# Patient Record
Sex: Female | Born: 1990 | Hispanic: Yes | Marital: Married | Smoking: Never smoker
Health system: Southern US, Community
[De-identification: ages and names within clinical notes are randomized; demographics above are authoritative.]

## PROBLEM LIST (undated history)

## (undated) ENCOUNTER — Inpatient Hospital Stay (HOSPITAL_COMMUNITY): Payer: Self-pay

## (undated) DIAGNOSIS — E669 Obesity, unspecified: Secondary | ICD-10-CM

## (undated) DIAGNOSIS — Z789 Other specified health status: Secondary | ICD-10-CM

---
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
## 2008-01-14 ENCOUNTER — Ambulatory Visit: Payer: Self-pay | Admitting: Family Medicine

## 2008-02-24 ENCOUNTER — Ambulatory Visit: Payer: Self-pay | Admitting: Family Medicine

## 2008-05-26 ENCOUNTER — Ambulatory Visit: Payer: Self-pay | Admitting: Family Medicine

## 2008-07-15 IMAGING — US US OB COMP LESS 14 WK
1 series · 14 of 16 positions shown · non-contrast
Comparison: None.

CLINICAL DATA: 16-year-old 12 weeks pregnant with bleeding.
 OBSTETRICAL ULTRASOUND <14 WKS:
TECHNIQUE: Transabdominal ultrasound was performed for evaluation of the gestation as well as the maternal uterus and adnexal regions.

[Series 1: us ob comp less 14 wk · 0.33mm/px · 14 of 16 slices shown]
[im 1/16]
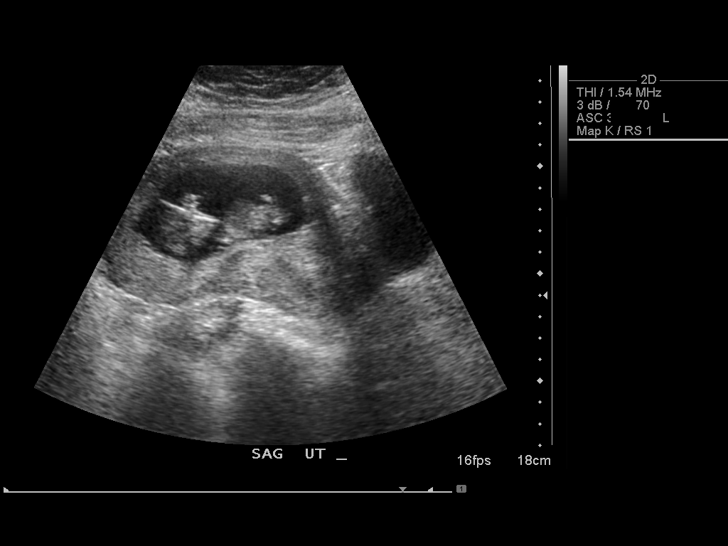
[im 2/16]
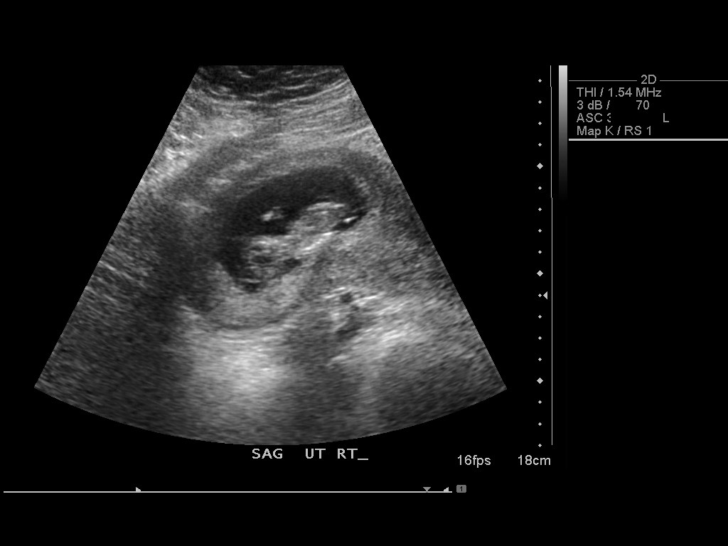
[im 3/16]
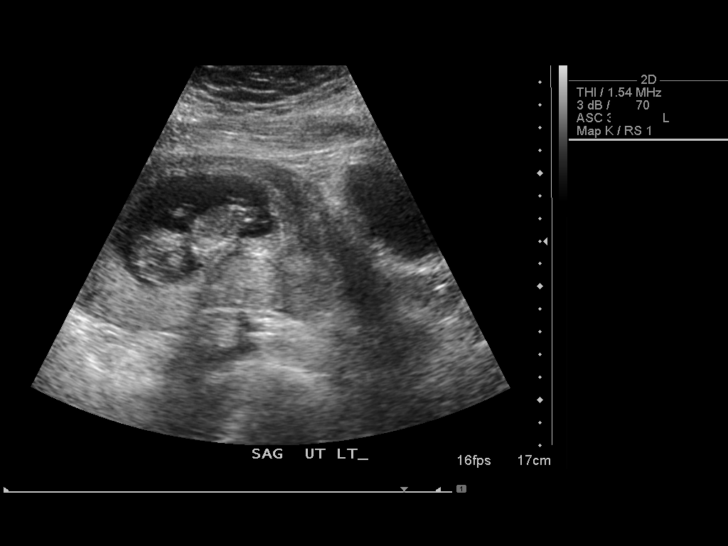
[im 5/16]
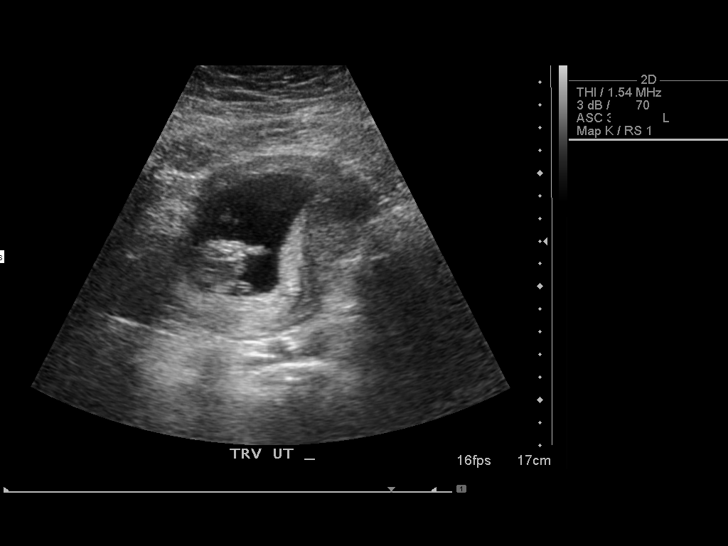
[im 6/16]
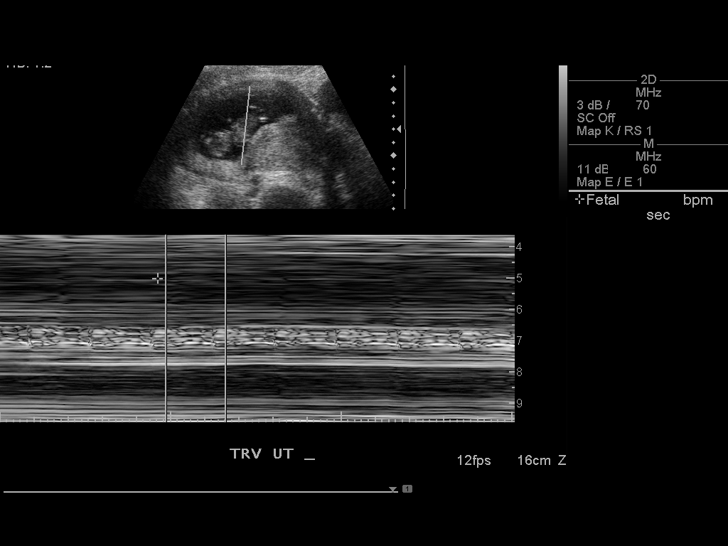
[im 7/16]
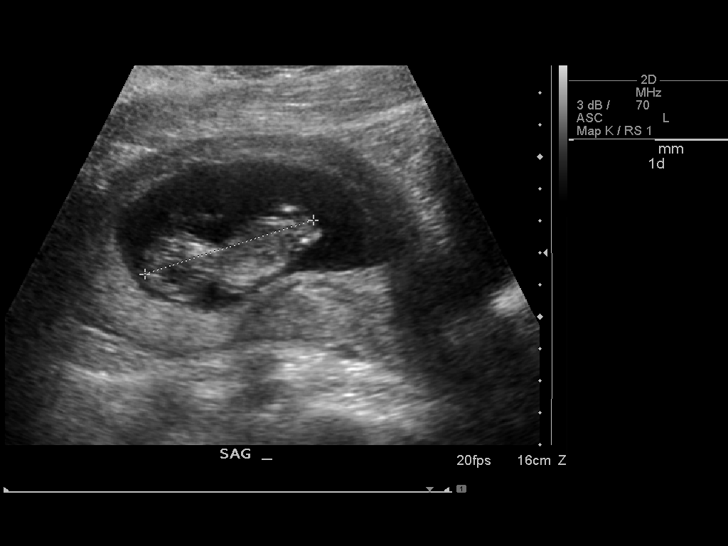
[im 8/16]
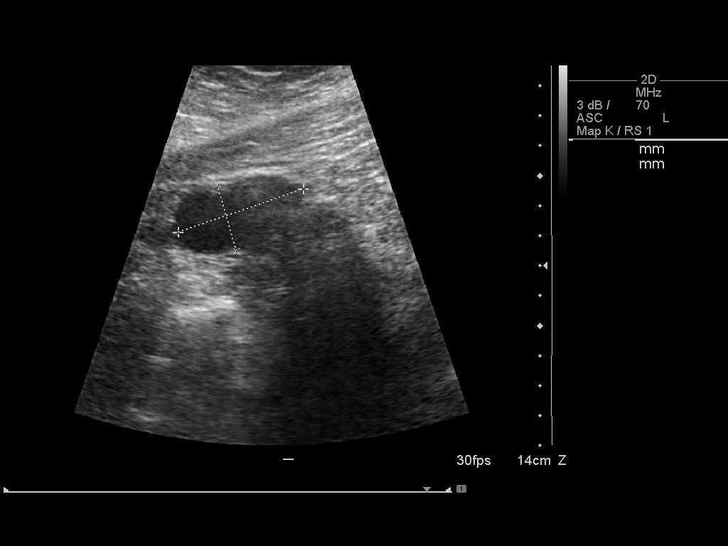
[im 9/16]
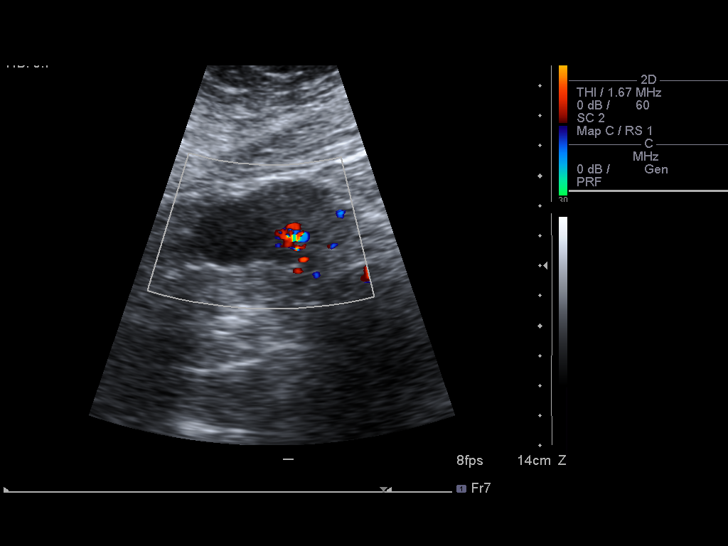
[im 10/16]
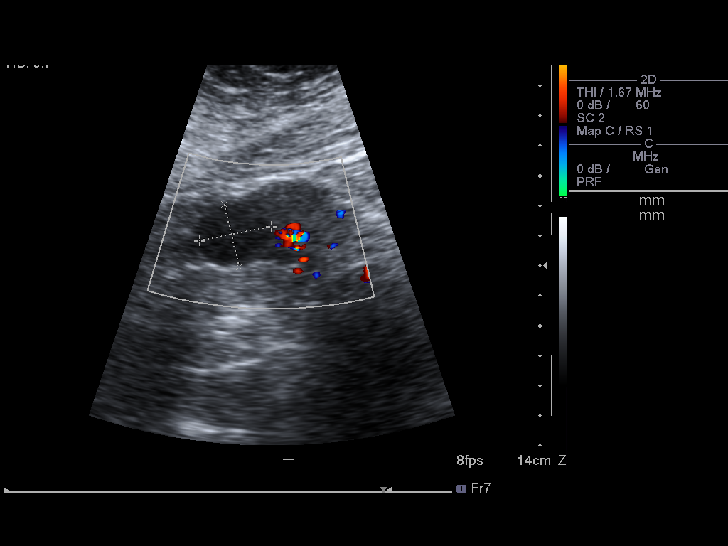
[im 11/16]
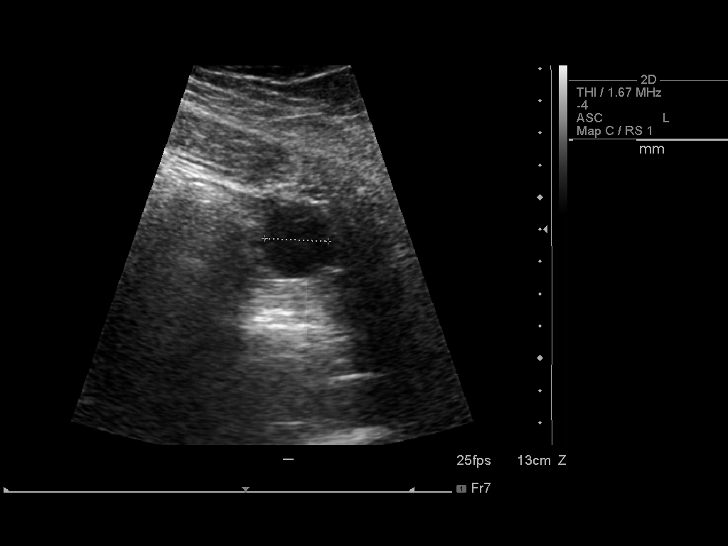
[im 13/16]
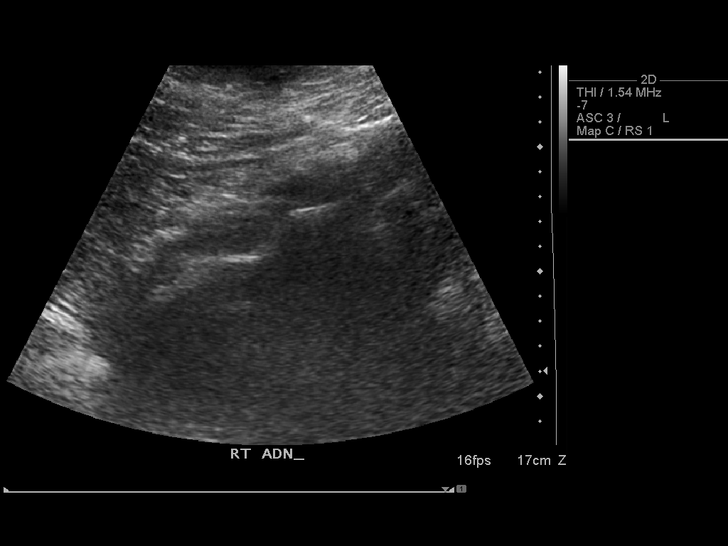
[im 14/16]
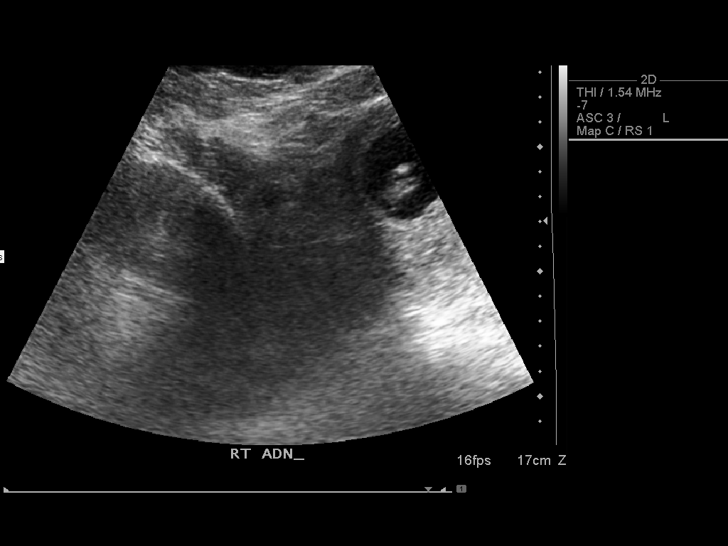
[im 15/16]
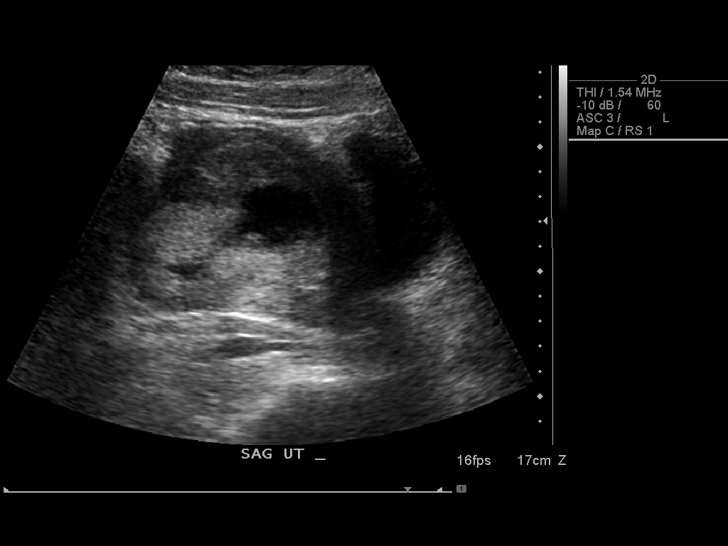
[im 16/16]
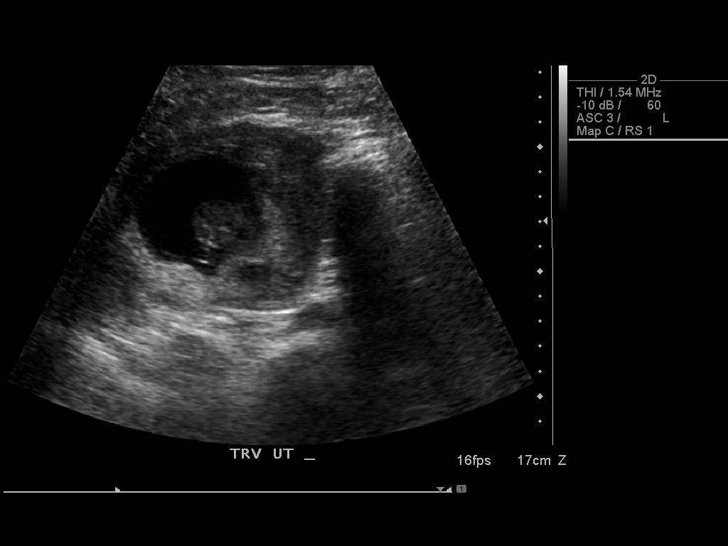

[14 of 16 positions shown; findings below may reference images not displayed]

FINDINGS: There is an intrauterine gestational sac with a living fetus estimated at 12 weeks 1 day gestation based on crown rump length of 55.3 mm.  Heart rate is 171 beats per minute.
 Small subchorionic hemorrhage is noted.  The right ovary could not be visualized.  The left ovary is normal in size and has a corpus luteum cyst associated with it. 
 No free pelvic fluid collections are seen.
IMPRESSION: Living      intrauterine fetus estimated at 12 weeks 1 day gestation.
  Small      subchorionic hemorrhage.
  Corpus      luteum cyst left ovary.
  No      free pelvic fluid.

## 2008-08-25 ENCOUNTER — Ambulatory Visit: Payer: Self-pay | Admitting: Family Medicine

## 2009-06-20 IMAGING — CR DG CHEST 1V PORT
1 series · 1 of 1 positions shown · non-contrast
Comparison: None

CLINICAL DATA: Fever, nonproductive cough, and shortness of breath.

PORTABLE CHEST - 1 VIEW

[view not recorded]
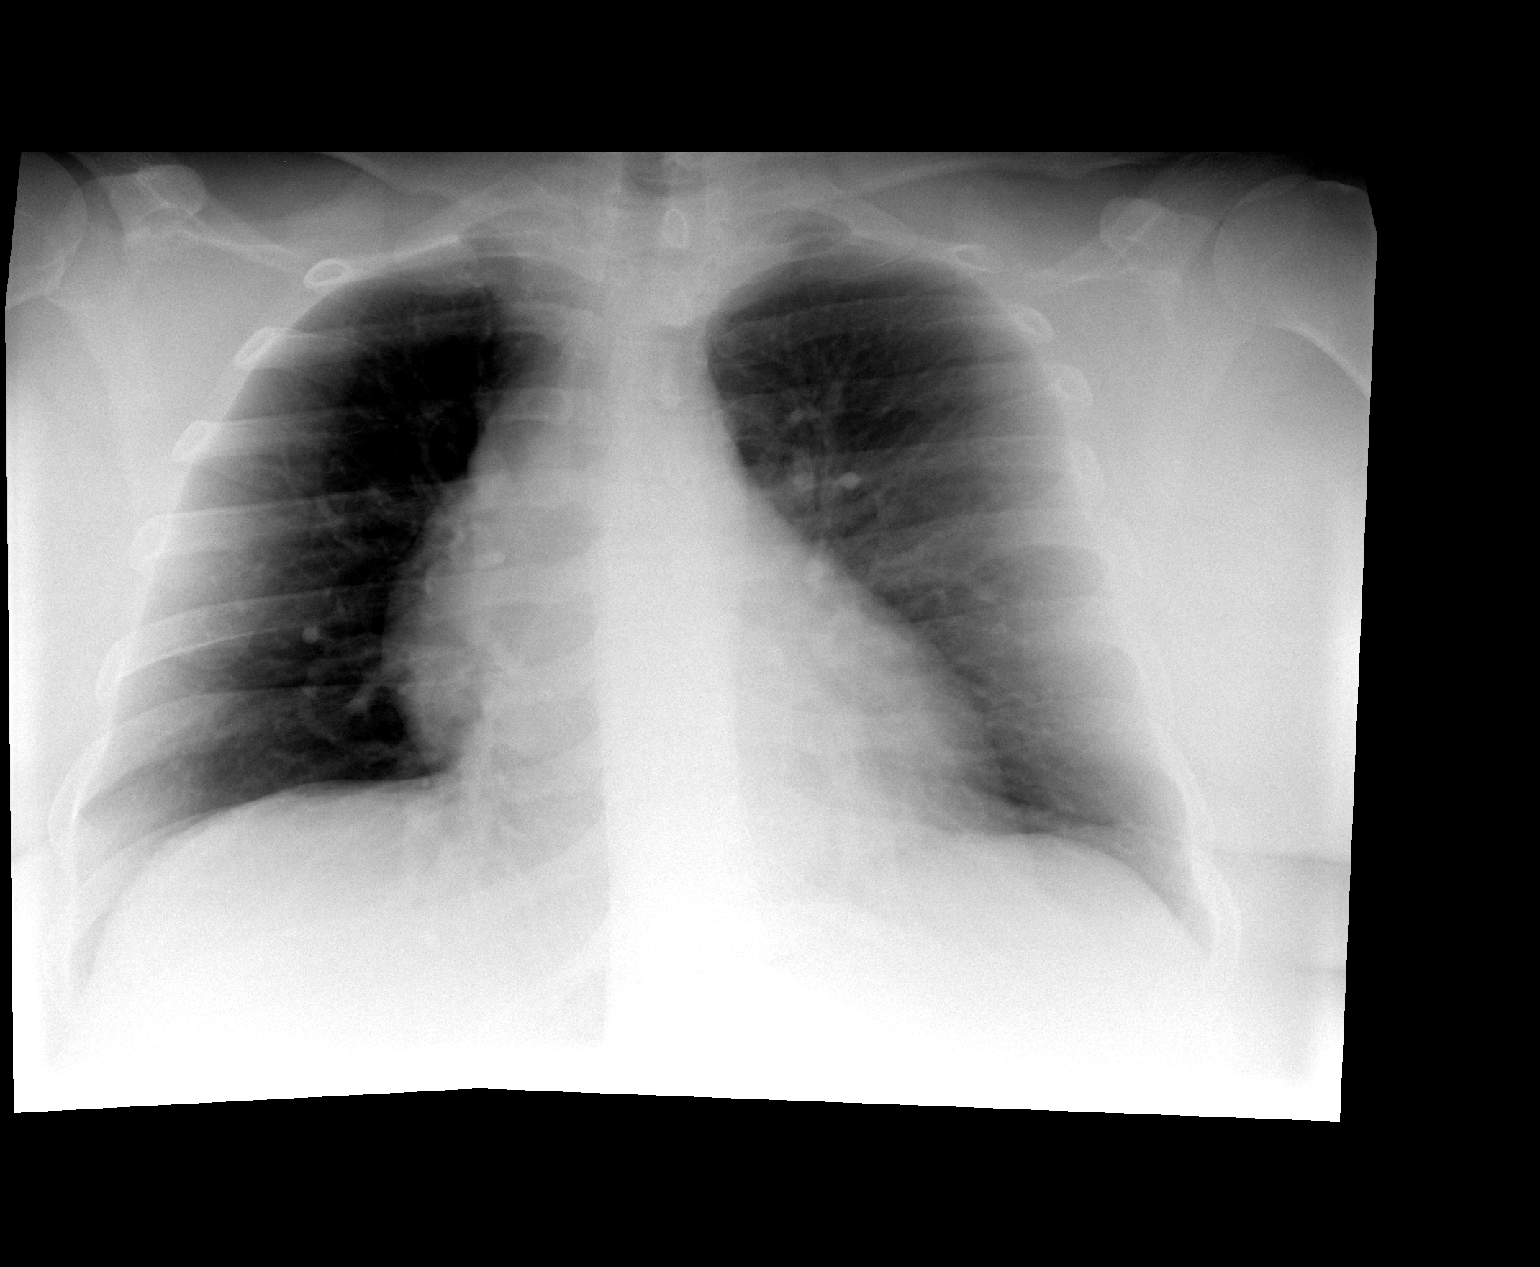

[1 of 1 positions shown; findings below may reference images not displayed]

FINDINGS: The heart size and vascularity are normal and the lungs
are clear.  No bony abnormality.
IMPRESSION: Normal chest.

## 2009-06-21 IMAGING — CR DG CHEST 1V PORT
1 series · 1 of 1 positions shown · non-contrast
Comparison: November 27, 2007

CLINICAL DATA: Fever and cough after C-section

PORTABLE CHEST - 1 VIEW

[view not recorded]
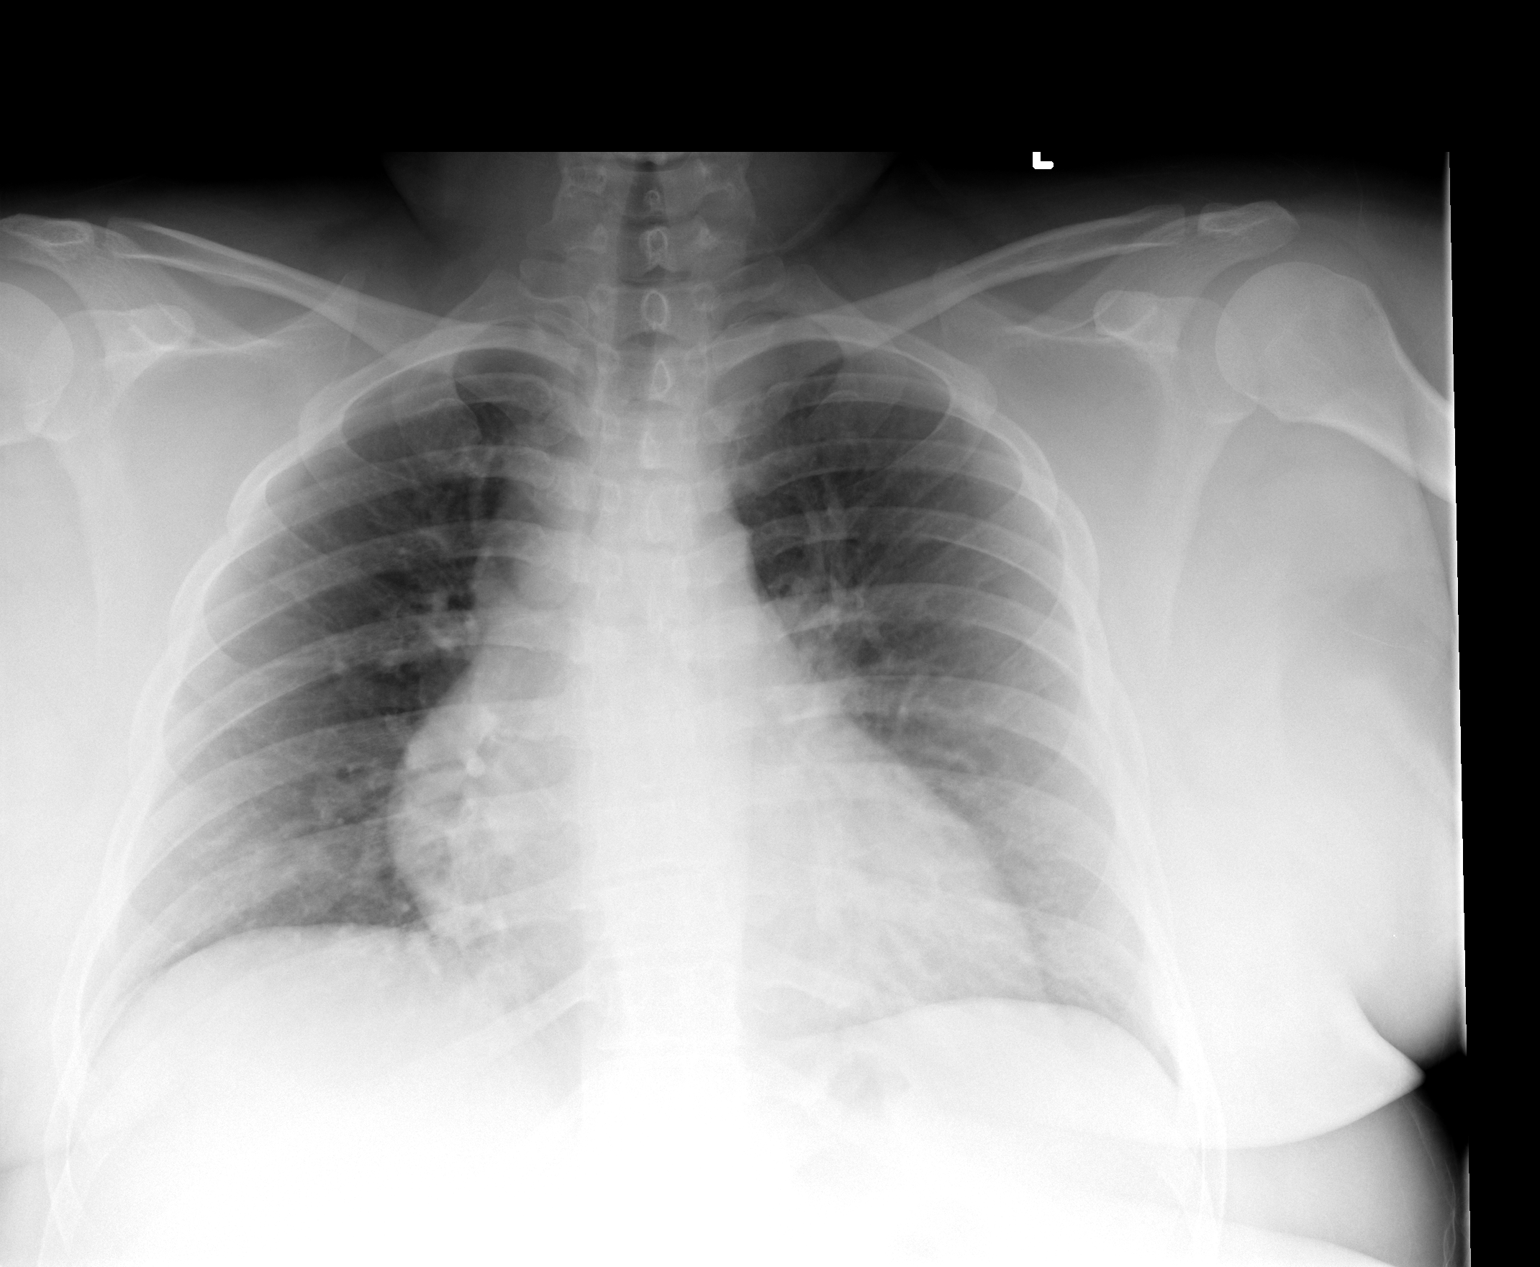

[1 of 1 positions shown; findings below may reference images not displayed]

FINDINGS: Subtle patchy opacity is present within the right lower
lung.  No gross pleural effusion is identified.  The left lung is
clear.  The cardiac silhouette, mediastinum, pulmonary vasculature
are within normal limits.
IMPRESSION: Question developing infiltrate in the right lower lung.

## 2010-01-13 ENCOUNTER — Ambulatory Visit: Payer: Self-pay | Admitting: Family Medicine

## 2010-01-13 ENCOUNTER — Encounter: Payer: Self-pay | Admitting: Family Medicine

## 2010-01-13 LAB — CONVERTED CEMR LAB
Antibody Screen: NEGATIVE
Basophils Absolute: 0 10*3/uL (ref 0.0–0.1)
Basophils Relative: 0 % (ref 0–1)
Eosinophils Absolute: 0.2 10*3/uL (ref 0.0–0.7)
Eosinophils Relative: 2 % (ref 0–5)
HCT: 38.5 % (ref 36.0–46.0)
HIV: NONREACTIVE
Hemoglobin: 12.8 g/dL (ref 12.0–15.0)
Hepatitis B Surface Ag: NEGATIVE
Lymphocytes Relative: 21 % (ref 12–46)
Lymphs Abs: 1.7 10*3/uL (ref 0.7–4.0)
MCHC: 33.2 g/dL (ref 30.0–36.0)
MCV: 95.3 fL (ref 78.0–100.0)
Monocytes Absolute: 0.2 10*3/uL (ref 0.1–1.0)
Monocytes Relative: 3 % (ref 3–12)
Neutro Abs: 5.8 10*3/uL (ref 1.7–7.7)
Neutrophils Relative %: 74 % (ref 43–77)
Platelets: 228 10*3/uL (ref 150–400)
RBC: 4.04 M/uL (ref 3.87–5.11)
RDW: 14 % (ref 11.5–15.5)
Rh Type: POSITIVE
Rubella: 104.3 intl units/mL — ABNORMAL HIGH
Sickle Cell Screen: NEGATIVE
WBC: 7.9 10*3/uL (ref 4.0–10.5)

## 2010-01-20 ENCOUNTER — Ambulatory Visit: Payer: Self-pay | Admitting: Family Medicine

## 2010-01-20 LAB — CONVERTED CEMR LAB: Pap Smear: NEGATIVE

## 2010-02-03 ENCOUNTER — Telehealth: Payer: Self-pay | Admitting: Family Medicine

## 2010-02-11 ENCOUNTER — Encounter: Payer: Self-pay | Admitting: Family Medicine

## 2010-02-15 ENCOUNTER — Ambulatory Visit: Admission: RE | Admit: 2010-02-15 | Discharge: 2010-02-15 | Payer: Self-pay | Source: Home / Self Care

## 2010-02-15 ENCOUNTER — Encounter: Payer: Self-pay | Admitting: Family Medicine

## 2010-02-15 LAB — CONVERTED CEMR LAB
Chlamydia, DNA Probe: NEGATIVE
GC Probe Amp, Genital: NEGATIVE

## 2010-02-21 LAB — GLUCOSE, CAPILLARY: Glucose-Capillary: 100 mg/dL — ABNORMAL HIGH (ref 70–99)

## 2010-03-10 NOTE — Assessment & Plan Note (Signed)
Summary: NOB/AAM/DSL   Vital Signs:  Patient profile:   20 year old female LMP:     09/20/2009 Weight:      192.06 pounds Pulse rate:   82 / minute BP sitting:   136 / 77  Vitals Entered By: Jone Baseman CMA (January 20, 2010 3:36 PM) LMP (date): 09/20/2009 EDC by LMP==> 06/27/2009 LMP - Character: normal LMP - Reliable? approximate (month known) Menarche (age onset years): 8   Menses interval (days): irregular Menstrual flow (days): 7 On BCP's at conception: no Date of + home preg. test: 10/07/2009 Enter LMP: 09/20/2009   Current Problems (verified): 1)  Supervision of Other Normal Pregnancy  (ICD-V22.1) 2)  Pregnancy With History of Abortion  (ICD-V23.2) 3)  Abortion, Spontaneous, Complete, With Hemorrhage  (ZOX-096.04)  Current Medications (verified): 1)  Prenatal Vitamins 0.8 Mg Tabs (Prenatal Multivit-Min-Fe-Fa) .... One By Mouth Daily  Past History:  Past Medical History: Pt had fever during labor with first child, with concern for PNA or Influenza, and had c/s secondary to late decellerations and failure to progress.   Social History: Education:  Graduated HS in [place]   Impression & Recommendations:  Problem # 1:  SUPERVISION OF OTHER NORMAL PREGNANCY (ICD-V59.13) 20 year old G3P1011 at 17.3 weeks.  Pap smear done today.  Will refer for anatomy US and follow up in 4 weeks.  Orders: Other OB visit- [facility] (OBCK)  Complete Medication List: 1)  Prenatal Vitamins 0.8 Mg Tabs (Prenatal multivit-min-fe-fa) .... One by mouth daily  Other Orders: Prenatal U/S > 14 weeks - 54098 (Prenatal U/S) Pap Smear-[facility] ([id number])  Patient Instructions: 1)  It was nice to meet you today.  The office will call you to schedule your Korea.   2)  Please make an appointment to see me again in 4 weeks. 3)  Please take a prenatal vitamin every day 4)  Contact the office if you have any problems or questions.    Orders Added: 1)  Prenatal U/S > 14 weeks - 62130 [Prenatal  U/S] 2)  Pap Smear-[facility] [[id number]] 3)  Other OB visit- [facility] [OBCK]     OB Initial Intake Information    Positive HCG by: [facility]    Race: Hispanic    Marital status: Single    Occupation: homemaker    Education (last grade completed): Graduated HS in [place]    Number of children at home: 1    [facility]    Newborn's physician: Ruthe Mannan  FOB Information    Husband/Father of baby: Roseanne Reno    FOB occupation Holiday representative    Phone: [phone]    FOB Comments: FOB is involved.  Menstrual History    LMP (date): 09/20/2009    EDC by LMP: 06/27/2009    LMP - Character: normal    Menarche: 8 years    Menses interval: irregular days    Menstrual flow 7 days    On BCP's at conception: no    Date of positive (+) home preg. test: 10/07/2009    Pre Pregnancy Weight: 180 lbs.   Flowsheet View for Follow-up Visit    Estimated weeks of       gestation:     17.3    Weight:     192.06    Blood pressure:   136 / 77    Headache:     No    Nausea/vomiting:   nausea    Edema:     0  Vaginal bleeding:   no    Vaginal discharge:   no   Past Pregnancy History    Gravida:     3    Living Children:   1    Para:       1  Pregnancy # 2    Delivery date:     11/27/2007    Weeks Gestation:   term    Preterm labor:     no    Delivery type:     C-section    Delivery location:     [facility]    Infant Sex:     Female    Name:     Cala Bradford    Comments:     Pt. had fever during labor with first child and was treated for PNA and Influenza.  C/S for late decellerations.    Genetic History     Down's Syndrome:           comments: Paternal cousin has ? down's syndrome  Genetic Counseling:    Pt. declined quad screen or any genetic testing.    Appended Document: NOB/AAM/DSL Pt needs early glucola for obesity, ethnic background. Needs flu shot. Will need review of infections/exposures. Pt has h/o section.  Will need appt with OBs at 30  weeks to discuss delivery planning.  Appended Document: NOB/AAM/DSL pt also needs gc/cz screening.

## 2010-03-10 NOTE — Progress Notes (Signed)
Summary: status  Phone Note Call from Patient   Caller: Patient Summary of Call: Pt called to ask for Korea status.  Initial call taken by: Marines Jean Rosenthal,  February 03, 2010 10:36 AM  Follow-up for Phone Call        Marines this is the one that you were giving the appt to an AAM interpretor, that was in the office yesterday,  to call and let her know  Appt @ Dr.Marshalls office for 1.6.12 @ 10am.  AAM interpretor to inform per [employer]. ............................................... Delora Fuel February 08, 2010 9:58 AM Follow-up by: Jone Baseman CMA,  February 09, 2010 5:28 PM

## 2010-03-10 NOTE — Assessment & Plan Note (Signed)
Summary: ob visit/eo   Vital Signs:  Patient profile:   20 year old female Weight:      194.2 pounds Pulse rate:   101 / minute BP sitting:   108 / 73  Vitals Entered By: Garen Grams LPN (February 15, 2010 2:17 PM)   Impression & Recommendations:  Problem # 1:  SUPERVISION OF OTHER NORMAL PREGNANCY (ICD-V22.1) Pt. with BMI >30, so early 1 hour glucola done, which was normal.  Will repeat at 28 weeks.  Pt. reports she got her US done at Dr. Elsie Stain office, will try to get report.  G/C Chlamydia was not done on initial OB labs, so the screen was done today.  Overall pt. doing well.  Orders: Glucose 1 hr-[facility] (82950) GC/Chlamydia-[facility] (87591/87491) Other OB visit- [facility] (OBCK)  Complete Medication List: 1)  Prenatal Vitamins 0.8 Mg Tabs (Prenatal multivit-min-fe-fa) .... One by mouth daily  Patient Instructions: 1)  It was good to see you.  Your baby's heart rate today was 135, which is normal.  It is OK if you only feel your baby moving a little at this point in your pregnancy- I think you will feel baby moving more over the next four weeks. 2)  Take your prenatal vitamin every day 3)  Come back and see me again in 4 weeks for follow up.    Orders Added: 1)  Glucose 1 hr-[facility] [82950] 2)  GC/Chlamydia-[facility] [[id number]] 3)  Other OB visit- [facility] [OBCK]     OB Initial Intake Information    Positive HCG by: [facility]    Race: Hispanic    Marital status: Single    Occupation: homemaker    Education (last grade completed): Graduated HS in [place]    Number of children at home: 1    [facility]ospital [facility] delivery: [facility]    Newborn's physician: Ruthe Mannan  FOB Information    Husband/Father of baby: Roseanne Reno    FOB occupation Holiday representative    Phone: [phone]    FOB Comments: FOB is involved.  Menstrual History    LMP (date): 09/20/2009    EDC by LMP: 06/27/2010    Best Working EDC: 06/27/2010    LMP - Character: normal    Menarche: 8 years    Menses  interval: irregular days    Menstrual flow 7 days    On BCP's at conception: no    Date of positive (+) home preg. test: 10/07/2009   Flowsheet View for Follow-up Visit    Estimated weeks of       gestation:     21 1/7    Weight:     194.2    Blood pressure:   108 / 73    Headache:     No    Nausea/vomiting:   No    Edema:     0    Vaginal bleeding:   no    Vaginal discharge:   no    Fundal height:      22    FHR:       135    Fetal activity:     yes    Labor symptoms:   no    Fetal position:     ??    Taking prenatal vits?   Y    Smoking:     n/a    Next visit:     4 wk    Resident:     Lula Olszewski   Prenatal Visit Concerns  noted: Pt says she is feeling the baby move a little bit, but is wondering if she should feel baby move more by 21 weeks  She says she had an Korea at Dr. Elsie Stain office, but they could not tell if the baby was a boy or a girl.  EDC Confirmation:    New working [facility]: 06/27/2010    EDC by LMP: 06/27/2010   Past Pregnancy History    Gravida:     3    Term Births:     0    Premature Births:   0    Living Children:   1    Para:       1    Mult. Births:     0    Prev C-Section:   0    Aborta:     0    Elect. Ab:     0    Spont. Ab:     1    Ectopics:     0  Pregnancy # 1    Delivery date:     12/31/2006    Weeks Gestation:   13    Comments:     septic abortion  Pregnancy # 2    Delivery date:     11/27/2007    Weeks Gestation:   term    Preterm labor:     no    Delivery type:     C-section    Delivery location:     [facility]    Infant Sex:     Female    Name:     Cala Bradford    Comments:     Pt. had fever during labor with first child and was treated for PNA and Influenza.  C/S for late decellerations.

## 2010-03-11 ENCOUNTER — Encounter: Payer: Self-pay | Admitting: Family Medicine

## 2010-03-21 ENCOUNTER — Ambulatory Visit (INDEPENDENT_AMBULATORY_CARE_PROVIDER_SITE_OTHER): Payer: Self-pay | Admitting: Family Medicine

## 2010-03-21 ENCOUNTER — Encounter: Payer: Self-pay | Admitting: Family Medicine

## 2010-03-21 VITALS — BP 108/68 | Wt 199.2 lb

## 2010-03-21 DIAGNOSIS — Z3480 Encounter for supervision of other normal pregnancy, unspecified trimester: Secondary | ICD-10-CM

## 2010-03-21 DIAGNOSIS — Z23 Encounter for immunization: Secondary | ICD-10-CM

## 2010-03-21 MED ORDER — INFLUENZA VAC TYP A&B SURF ANT IM INJ
0.5000 mL | INJECTION | Freq: Once | INTRAMUSCULAR | Status: AC
  Administered 2010-03-21: 0.5 mL via INTRAMUSCULAR

## 2010-03-21 NOTE — Patient Instructions (Signed)
It was good to see you.  I want you to make an appointment to be seen here in the office in the [facility] in two weeks and see me again in four weeks.  Please contact the office if you have questions.

## 2010-03-22 DIAGNOSIS — Z3480 Encounter for supervision of other normal pregnancy, unspecified trimester: Secondary | ICD-10-CM | POA: Insufficient documentation

## 2010-03-22 NOTE — Progress Notes (Deleted)
  Subjective:    Autumn Johnson is being seen today for a follow up obstetrical visit.  This is a planned pregnancy. She is at [redacted]w[redacted]d gestation. Her obstetrical history is significant for obesity. Relationship with FOB: significant other, living together. Patient does intend to breast feed. Pregnancy history fully reviewed.   Patient reports backache. No other complaints.  She asks questions about what exercise is safe during pregnancy.     Objective:     BP 108/68  Wt 199 lb 3.2 oz (90.357 kg)  LMP 09/20/2009 Physical Exam  Exam General appearance: alert, cooperative and no distress Eyes: PERRL, EOMIT Nose: no discharge Throat: lips, mucosa, and tongue normal; teeth and gums normal Heart: regular rate and rhythm, S1, S2 normal, no murmur, click, rub or gallop Abdomen: Gravid, non-tender Extremities: extremities normal, atraumatic, no cyanosis or edema Pulses: 2+ and symmetric Skin: Skin color, texture, turgor normal. No rashes or lesionsmr  Assessment:    Pregnancy: G3P1011 Patient Active Problem List  Diagnoses  . ABORTION, SPONTANEOUS, COMPLETE, WITH HEMORRHAGE  . Supervision of other normal pregnancy       Plan:    Continue Prenatal vitamins. Pt with BMI > 30, Early Glucola done, was negative.  Will repeat next visit (28 weeks).  Discussed safe exercise (walking, dancing OK but no bending/heavy lifting).    Follow up in 2 weeks.  Autumn Johnson 03/22/2010

## 2010-03-22 NOTE — Progress Notes (Signed)
Subjective:   Autumn Johnson is being seen today for a follow up obstetrical visit. This is a planned pregnancy. She is at [redacted]w[redacted]d gestation. Her obstetrical history is significant for obesity. Relationship with FOB: significant other, living together. Patient does intend to breast feed. Pregnancy history fully reviewed.  Patient reports backache. No other complaints. She asks questions about what exercise is safe during pregnancy.  Objective:    BP 108/68  Wt 199 lb 3.2 oz (90.357 kg)  LMP 09/20/2009  Physical Exam  Exam  General appearance: alert, cooperative and no distress  Eyes: PERRL, EOMIT  Nose: no discharge  Throat: lips, mucosa, and tongue normal; teeth and gums normal  Heart: regular rate and rhythm, S1, S2 normal, no murmur, click, rub or gallop  Abdomen: Gravid, non-tender  Extremities: extremities normal, atraumatic, no cyanosis or edema  Pulses: 2+ and symmetric  Skin: Skin color, texture, turgor normal. No rashes or lesionsmr  Assessment:   Pregnancy: G3P1011  Patient Active Problem List   Diagnoses   .  ABORTION, SPONTANEOUS, COMPLETE, WITH HEMORRHAGE   .  Supervision of other normal pregnancy     Plan:   Continue Prenatal vitamins.  Flu Shot today. Pt with BMI > 30, Early Glucola done, was negative. Will repeat next visit (28 weeks).  Discussed safe exercise (walking, dancing OK but no bending/heavy lifting).  Follow up in 2 weeks.  Khalani Novoa  03/22/2010

## 2010-03-22 NOTE — Assessment & Plan Note (Signed)
Pt. Doing well.  Discussed goal of minimal weight gain for the remainder of pregnancy and safe exercises. Flu shot today. Pt. Will follow up in two weeks, repeat 1 hour Glucola.

## 2010-04-04 ENCOUNTER — Ambulatory Visit (INDEPENDENT_AMBULATORY_CARE_PROVIDER_SITE_OTHER): Payer: Self-pay | Admitting: Family Medicine

## 2010-04-04 VITALS — BP 121/71 | Temp 97.9°F | Wt 201.0 lb

## 2010-04-04 DIAGNOSIS — Z3480 Encounter for supervision of other normal pregnancy, unspecified trimester: Secondary | ICD-10-CM

## 2010-04-04 LAB — GLUCOSE, CAPILLARY: Glucose-Capillary: 93 mg/dL (ref 70–99)

## 2010-04-04 NOTE — Progress Notes (Signed)
  Subjective:    Patient ID: Autumn Johnson, female    DOB: 1990-10-11, 20 y.o.   MRN: [id number]  HPI Pt. Is a here for her Prenatal Exam. She has had some minor spotting. She has a normal fetal heart tone today on doppler. She has no other questions. She plans on use contraceptives when the baby is born.   Review of Systems  Constitutional: Negative.   Respiratory: Negative.   Cardiovascular: Negative.   Genitourinary: Negative.        Objective:   Physical Exam  Constitutional: She appears well-developed and well-nourished. No distress.  HENT:  Head: Normocephalic and atraumatic.  Neck: Normal range of motion. Neck supple.  Cardiovascular: Normal rate and regular rhythm.   Pulmonary/Chest: Effort normal and breath sounds normal.  Abdominal: Soft. There is no tenderness.          Assessment & Plan:  Normal 31 wOld female prenatal exam. 1. F/u in one month. 2. Glucose tolerance test pending.

## 2010-04-21 ENCOUNTER — Encounter: Payer: Self-pay | Admitting: Family Medicine

## 2010-04-21 ENCOUNTER — Ambulatory Visit (INDEPENDENT_AMBULATORY_CARE_PROVIDER_SITE_OTHER): Payer: Self-pay | Admitting: Family Medicine

## 2010-04-21 VITALS — BP 106/66 | Wt 202.5 lb

## 2010-04-21 DIAGNOSIS — Z98891 History of uterine scar from previous surgery: Secondary | ICD-10-CM | POA: Insufficient documentation

## 2010-04-21 DIAGNOSIS — Z9889 Other specified postprocedural states: Secondary | ICD-10-CM

## 2010-04-21 DIAGNOSIS — Z3480 Encounter for supervision of other normal pregnancy, unspecified trimester: Secondary | ICD-10-CM

## 2010-04-21 LAB — CONVERTED CEMR LAB
HCT: 36.5 % (ref 36.0–46.0)
Hemoglobin: 12 g/dL (ref 12.0–15.0)
MCV: 98.1 fL (ref 78.0–100.0)
Platelets: 181 10*3/uL (ref 150–400)
WBC: 7.2 10*3/uL (ref 4.0–10.5)

## 2010-04-21 LAB — CBC
HCT: 36.5 % (ref 36.0–46.0)
Hemoglobin: 12 g/dL (ref 12.0–15.0)
MCH: 32.3 pg (ref 26.0–34.0)
MCHC: 32.9 g/dL (ref 30.0–36.0)
MCV: 98.1 fL (ref 78.0–100.0)
Platelets: 181 10*3/uL (ref 150–400)
RBC: 3.72 MIL/uL — ABNORMAL LOW (ref 3.87–5.11)
RDW: 13.6 % (ref 11.5–15.5)
WBC: 7.2 10*3/uL (ref 4.0–10.5)

## 2010-04-21 LAB — HIV ANTIBODY (ROUTINE TESTING W REFLEX): HIV: NONREACTIVE

## 2010-04-21 LAB — RPR

## 2010-04-21 NOTE — Patient Instructions (Signed)
Fue un Research officer, trade union.  La medida que sacamos hoy esta' grande para su tiempo de Futures trader.  Por eso, queremos mandarle para otro [employer] de la [facility]).  Va a ser importante que Ud saque la tarjeta anaranjada con [employer].   Mantenga su cita con la Dra. Chamberlain en 2 semanas.   Le estamos refiriendo para Optician, dispensing con los obstetras en [facility] de la Mujer para hablar sobre el parto natural despues de haber tenido una cesaria previamente.   CONFIRM PT'S APPOINTMENT WITH DR CHAMBERLAIN IN 2 WEEKS.  OB REFERRAL DONE FOR DELIVERY PLAN, PRIOR C/SECTION.  PLEASE GIVE SPANISH-LANGUAGE SHEET TO OBTAIN ORANGE CARD "[place] CARD"

## 2010-04-21 NOTE — Progress Notes (Signed)
[redacted]w[redacted]d.   In OB clinic.  Discussed in Spanish with Dr. Mauricio Po.  Pt measuring 34-35 and will need repeat u/s for growth.  Will give Autumn Johnson info b/c pt will need WH u/s this time.  HIV/RPR/CBC today.  Will refer to [facility] for VBAC consult.  RTC in 2 weeks with PCP

## 2010-04-22 ENCOUNTER — Telehealth: Payer: Self-pay | Admitting: Family Medicine

## 2010-04-22 NOTE — Telephone Encounter (Signed)
Attempted to call patient to report negative lab work ordered in [facility] clinic yesterday.  The phone numbers listed in chart are not connected.  WIll send generic letter informing of negative labs.  Will forward to PCP to discuss, to request updated phone number at next visit.

## 2010-05-06 ENCOUNTER — Other Ambulatory Visit: Payer: Self-pay | Admitting: Family Medicine

## 2010-05-06 ENCOUNTER — Ambulatory Visit (INDEPENDENT_AMBULATORY_CARE_PROVIDER_SITE_OTHER): Payer: Self-pay | Admitting: Family Medicine

## 2010-05-06 ENCOUNTER — Encounter: Payer: Self-pay | Admitting: Family Medicine

## 2010-05-06 VITALS — BP 119/74 | Temp 98.0°F | Wt 203.5 lb

## 2010-05-06 DIAGNOSIS — Z3480 Encounter for supervision of other normal pregnancy, unspecified trimester: Secondary | ICD-10-CM

## 2010-05-06 MED ORDER — PRENATAL VITAMINS 0.8 MG PO TABS: 1.0000 | ORAL_TABLET | Freq: Every day | ORAL | Status: DC

## 2010-05-06 NOTE — Patient Instructions (Signed)
It was good to see you.  The office will call you with your Ultrasound appointment. Please keep your appointment at [facility] hospital on April 23rd.  Please make an appointment to see me again in 2 weeks.

## 2010-05-07 ENCOUNTER — Encounter: Payer: Self-pay | Admitting: Family Medicine

## 2010-05-07 NOTE — Progress Notes (Signed)
Subjective:    Autumn Johnson is a 20 y.o. female being seen today for her obstetrical visit. She is at [redacted]w[redacted]d gestation. Patient reports backache, no bleeding, no contractions and no leaking. Fetal movement: normal.  Menstrual History: OB History    Grav Para Term Preterm Abortions TAB SAB Ect Mult Living   3 1 1  1  1   1        Patient's last menstrual period was 09/20/2009.    Review of Systems A comprehensive review of systems was negative except for: mild lower extremity swelling, back ache.     Objective:    BP 119/74  Temp 98 F (36.7 C)  Wt 203 lb 8 oz (92.307 kg)  LMP 09/20/2009 FHT:  130 BPM  Uterine Size: size greater than dates and 40 cm  Presentation: cephalic   General appearance: alert, cooperative and no distress Eyes: conjunctivae/corneas clear. PERRL, EOM's intact. Fundi benign. Throat: lips, mucosa, and tongue normal; teeth and gums normal Back: symmetric, no curvature. ROM normal. No CVA tenderness. Lungs: clear to auscultation bilaterally Heart: regular rate and rhythm, S1, S2 normal, no murmur, click, rub or gallop Abdomen: Gravid, non-tender. Extremities: edema trace and 2+ pulses  Assessment:    Pregnancy 32 and 4/7 weeks   Plan:    28-week labs reviewed, normal Pt's abdomen measures larger than her dates.  She had an early glucola and 28 week glucola that were normal.  Will order pt for follow up ultrasound for growth, EFW, AFI to evaluate. Pt has history of prior C-section and would like a TOLAC.  She has an appointment at the [facility] hospital clinic on April 23 to review the risks and benefits.   Pediatrician: selected. Follow up in 2 Weeks.

## 2010-05-11 ENCOUNTER — Ambulatory Visit (HOSPITAL_COMMUNITY)
Admission: RE | Admit: 2010-05-11 | Discharge: 2010-05-11 | Disposition: A | Discharge status: Home / Self Care | Payer: Medicaid Other | Source: Ambulatory Visit | Attending: Family Medicine | Admitting: Family Medicine

## 2010-05-11 ENCOUNTER — Other Ambulatory Visit: Payer: Self-pay | Admitting: Family Medicine

## 2010-05-11 DIAGNOSIS — O3660X0 Maternal care for excessive fetal growth, unspecified trimester, not applicable or unspecified: Secondary | ICD-10-CM | POA: Insufficient documentation

## 2010-05-19 ENCOUNTER — Ambulatory Visit (INDEPENDENT_AMBULATORY_CARE_PROVIDER_SITE_OTHER): Payer: Self-pay | Admitting: Family Medicine

## 2010-05-19 ENCOUNTER — Encounter: Payer: Self-pay | Admitting: Family Medicine

## 2010-05-19 VITALS — BP 110/70 | Wt 205.0 lb

## 2010-05-19 DIAGNOSIS — Z98891 History of uterine scar from previous surgery: Secondary | ICD-10-CM

## 2010-05-19 DIAGNOSIS — Z3490 Encounter for supervision of normal pregnancy, unspecified, unspecified trimester: Secondary | ICD-10-CM

## 2010-05-19 DIAGNOSIS — Z9889 Other specified postprocedural states: Secondary | ICD-10-CM

## 2010-05-19 DIAGNOSIS — Z3480 Encounter for supervision of other normal pregnancy, unspecified trimester: Secondary | ICD-10-CM

## 2010-05-19 NOTE — Patient Instructions (Signed)
It was good to see you.  Your due date is May 21st, 2012.  Today you are 34 weeks and 3 days.  Please keep your appointment at [facility] to talk about having a vaginal delivery after Cesarian section. Please make an appointment to see me in two weeks.

## 2010-05-19 NOTE — Progress Notes (Signed)
Subjective:    Autumn Johnson is a 20 y.o. female being seen today for her obstetrical visit. She is at [redacted]w[redacted]d gestation. Patient reports fullness in her thighs but denies contractions, pelvic pain or edema. . Fetal movement: normal. Patient has no other complaints.   Menstrual History: OB History    Grav Para Term Preterm Abortions TAB SAB Ect Mult Living   3 1 1  1  1   1        Patient's last menstrual period was 09/20/2009.      Review of Systems Pertinent items are noted in HPI.   Objective:    BP 110/70  Wt 205 lb (92.987 kg)  LMP 09/20/2009 FHT:  135 BPM  Uterine Size: 40 cm  Presentation: unsure     Assessment:    Pregnancy 34 and 3/7 weeks   Plan:   Patient had US done at [facility] for Size > Dates.  US WNL.  Fundal height still measures 40 cm, likely due to obesity.  Pediatrician: discussed and selected. Infant feeding: plans to breastfeed, plans to bottle feed. Patient scheduled for appt. at [facility] for VBAC Consent.  GBS Swab done today.  Follow up in 2 Weeks.

## 2010-05-21 LAB — STREP B DNA PROBE: GBSP: POSITIVE

## 2010-06-02 ENCOUNTER — Encounter: Payer: Self-pay | Admitting: Family Medicine

## 2010-06-02 ENCOUNTER — Ambulatory Visit (INDEPENDENT_AMBULATORY_CARE_PROVIDER_SITE_OTHER): Payer: Self-pay | Admitting: Family Medicine

## 2010-06-02 DIAGNOSIS — O99820 Streptococcus B carrier state complicating pregnancy: Secondary | ICD-10-CM | POA: Insufficient documentation

## 2010-06-02 DIAGNOSIS — Z3480 Encounter for supervision of other normal pregnancy, unspecified trimester: Secondary | ICD-10-CM

## 2010-06-02 NOTE — Patient Instructions (Signed)
It was good to see you.  Please make an appointment for one week.  If you think you are going into labor or your water has broken, please [facility] to [facility] to be [facility], or contact the office.

## 2010-06-04 NOTE — Progress Notes (Signed)
Subjective:    Autumn Johnson is a 20 y.o. female being seen today for her obstetrical visit. She is at [redacted]w[redacted]d gestation. Patient reports backache.  She complains that she has had some right sided back pain.  She says it comes and goes and she is not having the pain now. Fetal movement: normal.  Menstrual History: OB History    Grav Para Term Preterm Abortions TAB SAB Ect Mult Living   3 1 1  1  1   1        Patient's last menstrual period was 09/20/2009.    Review of Systems Negative for contractions, LOF, vaginal bleeding.   Objective:    BP 106/70  Wt 207 lb (93.895 kg)  LMP 09/20/2009 FHT: 140BPM  Uterine Size: 41 cm  Presentation: cephalic     Assessment:    Pregnancy 36 and 3/7 weeks   Plan:    28-week labs reviewed, normal Patient had visit at [facility] clinic, VBAC risks and benefits discussed, she is planning on trying a vaginal delivery.  Consent signed. Pediatrician: discussed and selected. Infant feeding: plans to breastfeed, plans to bottle feed. Patient notified that she is GBS positive and will recieve antibiotics duirng labor.  Follow up in 1 Week.

## 2010-06-10 ENCOUNTER — Ambulatory Visit: Payer: Self-pay | Admitting: Family Medicine

## 2010-06-10 NOTE — Progress Notes (Signed)
  Subjective:    Autumn Johnson is a 20 y.o. female being seen today for her obstetrical visit. She is at [redacted]w[redacted]d gestation. Patient reports backache and contractions since random, not close.. Fetal movement: normal.  Menstrual History: OB History    Grav Para Term Preterm Abortions TAB SAB Ect Mult Living   3 1 1  1  1   1       Menarche age: doesn't remember Patient's last menstrual period was 09/20/2009.    The following portions of the patient's history were reviewed and updated as appropriate: allergies, current medications, past family history, past medical history, past social history, past surgical history and problem list.  Review of Systems Pertinent items are noted in HPI.   Objective:    BP 106/68  Temp 97.7 F (36.5 C)  Wt 207 lb (93.895 kg)  LMP 09/20/2009 FHT: 140 BPM  Uterine Size: consistant with dates  Presentations: cephalic and feels proximal kicking, leopold performed  Pelvic Exam:              Dilation: not checked       Effacement:              Station:      Consistency:             Position:      Assessment:    Pregnancy 37 and 4/7 weeks   Plan:   Plans for delivery: Vaginal anticipated Beta strep culture: done Counseling: Cigarette smoking: never smoked. L&D discussion: symptoms of labor, rupture of membranes, hospital length of stay and discussed when to call. Fetal testing: discussed Follow up in 1 Week.

## 2010-06-14 ENCOUNTER — Ambulatory Visit (INDEPENDENT_AMBULATORY_CARE_PROVIDER_SITE_OTHER): Payer: Self-pay | Admitting: Family Medicine

## 2010-06-14 DIAGNOSIS — Z3480 Encounter for supervision of other normal pregnancy, unspecified trimester: Secondary | ICD-10-CM

## 2010-06-14 NOTE — Progress Notes (Signed)
  Subjective:    Autumn Johnson is a 20 y.o. female being seen today for her obstetrical visit. She is at [redacted]w[redacted]d gestation. Patient reports carpal tunnel symptoms, no bleeding, no contractions, no cramping and no leaking. Fetal movement: normal.  Menstrual History: OB History    Grav Para Term Preterm Abortions TAB SAB Ect Mult Living   3 1 1  1  1   1        Patient's last menstrual period was 09/20/2009.    The following portions of the patient's history were reviewed and updated as appropriate: allergies, current medications, past family history, past medical history, past social history, past surgical history and problem list.  Review of Systems A comprehensive review of systems was negative.   Objective:    BP 121/71  Temp 97.8 F (36.6 C)  Wt 209 lb (94.802 kg)  LMP 09/20/2009 FHT: 140 BPM  Uterine Size: size equals dates  Presentations: cephalic  Pelvic Exam:              Dilation: Closed       Effacement: Long             Station:  -3    Consistency: medium            Position: posterior     Assessment:    Pregnancy 38 and 1/7 weeks   Plan:   Plans for delivery: Vaginal anticipated Beta strep culture: done Counseling: L&D discussion: symptoms of labor and rupture of membranes. Fetal testing: discussed Follow up in 1 Week.

## 2010-06-21 ENCOUNTER — Ambulatory Visit (INDEPENDENT_AMBULATORY_CARE_PROVIDER_SITE_OTHER): Payer: Self-pay | Admitting: Family Medicine

## 2010-06-21 VITALS — BP 111/69 | Wt 209.0 lb

## 2010-06-21 DIAGNOSIS — Z3480 Encounter for supervision of other normal pregnancy, unspecified trimester: Secondary | ICD-10-CM

## 2010-06-21 NOTE — Discharge Summary (Signed)
[name], Johnson            ACCOUNT NO.:  [id number]   MEDICAL RECORD NO.:  [id number]          PATIENT TYPE:  INP   LOCATION:  9148                          [facility]  [facility]   PHYSICIAN:  Allie Bossier, MD        DATE OF BIRTH:  [birth year]   DATE OF ADMISSION:  11/26/2007  DATE OF DISCHARGE:  11/30/2007                               DISCHARGE SUMMARY   REASON FOR ADMISSION:  Spontaneous onset of labor.   HOSPITAL COURSE:  This is a 20 year old G2, P1 who presented at [facility] on November 27, 2007, at 39 weeks spontaneous onset of labor.  When she presented, she was 4 cm dilated, membranes were intact and she  was contracting every 1-2 minutes.  She was admitted to the floor and  labor precautions were started.  She did, however, had a fever of 101.5  on admission she was therefore placed on droplet precaution and started  on Tamiflu, azithromycin, and ceftriaxone for coverage of possible  pneumonia.  The patient was in active labor for about 15 hours but  failed to progress and was having late decelerations.  She reached about  6-7 cm but the baby's head remained at about -2 and -3 station.  She was  therefore taken to C-section.  A low-transverse C-section was performed  by Dr. Marice Potter and I assisted.  There were no complications.  She did have  some meconium-stained membranes with intact placenta with a 3-vessel  cord.  She delivered a viable baby girl with Apgars of 8 at 1 minute and  9 at 5 minutes and weight of 7 pounds.  Mother and baby had an  uneventful course except for mother remained febrile until the day prior  to her discharge.  She had 2 chest x-rays; the first one on admission  was normal, but the one on the second day of her admission showed a  possible right lower lobe infiltrate.  We therefore transitioned her to  p.o. antibiotics that will cover her pneumonia.  She was discharged home  on Augmentin and azithromycin and instructed to complete a full 10-day  course of antibiotics.  She was also sent home with her last day  Tamiflu.  She therefore finished her full 5-day course of treatment dose  of Tamiflu.  Mother and baby were discharged home.  She is bottle  feeding.  She had not decided yet on birth control.   DISCHARGE CONDITION:  Good.   FOLLOWUP:  The patient is to follow up with [facility] in 6  weeks.  We do not have appointments, therefore, she is instructed to  call [phone] in 3 weeks to make an appointment.  She was also  instructed to not place anything in the vagina for 6 weeks until we have  seen her.  She was also sent home with prescription for ibuprofen and  Percocet for pain.  All discharge instructions were completed with a  translator.      Ruthe Mannan, M.D.      Allie Bossier, MD  Electronically Signed    TA/MEDQ  D:  12/01/2007  T:  12/01/2007  Job:  [id number]

## 2010-06-21 NOTE — Progress Notes (Signed)
  Subjective:    Autumn Johnson is a 20 y.o. female being seen today for her obstetrical visit. She is at [redacted]w[redacted]d gestation. Patient reports no complaints. Fetal movement: normal.  Menstrual History: OB History    Grav Para Term Preterm Abortions TAB SAB Ect Mult Living   3 1 1  1  1   1       Menarche age:  Patient's last menstrual period was 09/20/2009.    The following portions of the patient's history were reviewed and updated as appropriate: allergies, current medications, past family history, past medical history, past social history, past surgical history and problem list.  Review of Systems Pertinent items are noted in HPI.   Objective:    BP 111/69  Wt 209 lb (94.802 kg)  LMP 09/20/2009 FHT: 140 BPM  Uterine Size: 39 cm  Presentations: cephalic  Pelvic Exam:              Dilation: 1cm       Effacement: Long             Station:  -3    Consistency: medium            Position: posterior     Assessment:    Pregnancy 39 and 1/7 weeks   Plan:   Plans for delivery: Vaginal anticipated Beta strep culture: results reviewed Counseling: L&D discussion: symptoms of labor, rupture of membranes and delivering clinician:  plans Physician. Postpartum supports and preparation: discussed need for postpartum assistance. Fetal testing: discussed Follow up in 1 Week.

## 2010-06-21 NOTE — Discharge Summary (Signed)
Autumn Johnson, Autumn Johnson                 ACCOUNT NO.:  [id number]   MEDICAL RECORD NO.:  [id number]          PATIENT TYPE:  INP   LOCATION:  9317                          [facility]ILITY[facility]  [facility]   PHYSICIAN:  Phil D. Okey Dupre, M.D.     DATE OF BIRTH:  Aug 03, 1990   DATE OF ADMISSION:  12/31/2006  DATE OF DISCHARGE:  01/02/2007                               DISCHARGE SUMMARY   ADMISSION DIAGNOSES:  1. Spontaneous septic abortion at 13 weeks and 3 days.  2. Fever.   DISCHARGE DIAGNOSES:  1. Spontaneous septic abortion at 13 weeks and 3 days.  2. Fever.   PROCEDURE:  None.   COMPLICATIONS:  None.   CONSULTATIONS:  None.   LABORATORY DATA:  The patient had an admission complete blood count  showing a white blood cell count of 18.6, hemoglobin 10.4, hematocrit  29.3, platelets 255.  Creatinine was drawn that was 0.46.  She had blood  cultures x2 on November 25, at 1 a.m. that were no growth to date at  time of discharge.   HISTORY OF PRESENT ILLNESS:  This is an 20 year old gravida 1, that  presented on December 31, 2006, with complaints of significant vaginal  bleeding.  She said that the bleeding started earlier on November 24,  while she was being evaluated in the ED she did pass products of  conception and at that time she was noted to have a temperature of  102.1.  The patient was admitted with diagnosis of a septic spontaneous  abortion.   HOSPITAL COURSE:  The patient was admitted and started on antibiotics  with ampicillin, clindamycin, and gentamicin.  She had fever noted at  admission of 100.7 at 12:15 a.m. on November 25.  She did receive a  fluid bolus of 1 liter normal saline on admission due to tachycardia in  the 160's, blood pressure 90/50.  She improved clinically and vitals  returned to normal with her temperature at time of discharge being 97.5,  pulse was 69, blood pressure 118/64.  Her bleeding had decreased to a  light trickle.  She had been on IV antibiotics for 36+ hours  and had  been afebrile for 36+ hours.  She was stable for discharge with normal  physical examination.   CONDITION ON DISCHARGE:  Stable.   DISCHARGE MEDICATIONS:  1. Continue prenatal vitamins as prescribed one daily.  2. Doxycycline 100 mg one tablet daily for seven days.   DISCHARGE INSTRUCTIONS:  1. Discharged to home.  2. Regular diet.  3. Activities as tolerated with no sex for the next six weeks.  4. The patient is to follow up at the [facility] with      Ruthe Mannan, M.D. on January 08, 2007, at 1:30 p.m.      Karlton Lemon, MD  Electronically Signed     ______________________________  Javier Glazier. Okey Dupre, M.D.    NS/MEDQ  D:  01/02/2007  T:  01/02/2007  Job:  [id number]   cc:   [facility]

## 2010-06-21 NOTE — Op Note (Signed)
NAME:  Autumn Johnson, CANTRALL            ACCOUNT NO.:  [id number]   MEDICAL RECORD NO.:  [id number]          PATIENT TYPE:  INP   LOCATION:  9174                          [facility]  [facility]   PHYSICIAN:  Allie Bossier, MD        DATE OF BIRTH:  1991/01/14   DATE OF PROCEDURE:  DATE OF DISCHARGE:                               OPERATIVE REPORT   PREOPERATIVE DIAGNOSES:  Arrest of descent dilatation, nonreassuring  fetal heart rate, maternal morbid obesity, maternal febrile illness.   POSTOPERATIVE DIAGNOSES:  Arrest of descent dilatation, nonreassuring  fetal heart rate, maternal morbid obesity, maternal febrile illness.   PROCEDURE:  Primary low transverse cesarean section.   SURGEON:  Allie Bossier, MD   ASSISTANT:  Ruthe Mannan, M.D.   ANESTHESIA:  Epidural, Belva Agee, MD   COMPLICATIONS:  None.   ESTIMATED BLOOD LOSS:  Less than 600 mL.   SPECIMENS:  Placenta and cord blood.   FINDINGS:  1. Normal adnexa.  2. Meconium-stained membranes of intact placenta with three-vessel      cord.  3. Living female infant, Apgars of 8 and 9 at 1 and 5 minutes, weight      pending.   DETAILED PROCEDURE AND FINDINGS:  Risks, benefits, alternatives of  surgery were explained and she accepted.  Consents were signed.  She was  taken to the operating room with droplet precautions in place.  Her  epidural was bolused for surgery.  She was placed in dorsal supine  position with a left lateral tilt.  Her abdomen was prepped and draped  in usual sterile fashion.  After adequate anesthesia was assured, a  transverse incision was made approximately 2 cm above the symphysis  pubis.  Incision was carried down through the subcutaneous tissue to the  fascia.  Fascia was scored in the midline.  Fascial incision was  extended bilaterally and curved slightly upwards.  The middle one-third  of the rectus muscles were separated in a transverse fashion using  electrosurgical technique.  Muscles were then bluntly  pulled to each  side.  The peritoneum was entered with hemostats.  The peritoneal  incision was extended bilaterally and curved slightly upwards taking  care to avoid the bladder.  A bladder blade was placed.  A transverse  incision was made on the very well developed lower uterine segment.  Amniotomy was performed with hemostat.  Meconium-stained fluids were  noted.  The baby was delivered from a vertex presentation with the  assistance of a Kiwi vacuum extractor to aid in delivering the head.  The suction was in the green zone and only one pull was required.  The  baby was then delivered.  The mouth and nostrils were suctioned.  The  cord was clamped and cut.  The baby was transferred to the NICU  personnel for routine care.  The Apgars are listed above.  Cord blood  sample was obtained.  The placenta was manually extracted and noted to  be intact, but meconium-stained.  The uterus was left in situ.  The  uterine interior was cleaned with a dry  lap sponge.  A bladder blade was  placed.  The uterine incision was closed with one layer of 0 chromic  running locking suture.  Excellent hemostasis was noted.  By tilting the  uterus, I was able to visualize the ovaries and oviducts on each side.  They appeared normal.  The uterine incision was again inspected and  noted to be hemostatic as were the rectus muscles and rectus fascia.  The fascia was then closed with 0 Vicryl running nonlocking suture.  No  defects were palpable.  The subcutaneous tissue was irrigated, cleaned,  and dried.  Please note that at the point where the fascia was beginning  to be closed, she was complaining of abdominal pain and Anesthesia fixed  this issue by pouring a topical anesthetic into her abdominal cavity at  the site of her fascia.  She had excellent anesthesia throughout the  case, tolerated the procedure well.  There were no defects noted after  completion of repairing the fascial incision.  The subcutaneous  tissue  was irrigated, cleaned, and dried.  A subcuticular closure was done with  3-0 Vicryl suture.  Steri-Strips were placed across the incisions.  She  was taken to recovery room in stable condition.  A Foley catheter  drained clear urine throughout.      Allie Bossier, MD  Electronically Signed     MCD/MEDQ  D:  11/27/2007  T:  11/28/2007  Job:  [id number]

## 2010-06-21 NOTE — Discharge Summary (Signed)
NAMEJORDON, BOURQUIN                 ACCOUNT NO.:  [id number]   MEDICAL RECORD NO.:  [id number]          PATIENT TYPE:  OBV   LOCATION:  9318                          [facility]   PHYSICIAN:  Norton Blizzard, MD    DATE OF BIRTH:  06-13-1990   DATE OF ADMISSION:  12/28/2006  DATE OF DISCHARGE:  12/28/2006                               DISCHARGE SUMMARY   ADMISSION DIAGNOSES:  1. Intrauterine pregnancy at 13 weeks 0 days.  2. Vaginal bleeding.   DISCHARGE DIAGNOSES:  1. Intrauterine pregnancy at 13 weeks 0 days.  2. Vaginal bleeding.   PROCEDURES:  The patient had an ultrasound performed on December 28, 2006, showing a single live intrauterine gestation with crown-rump  length corresponding with gestational age of [redacted] weeks 0 days.  There was  no significant residual subchorionic hematoma.  Amniotic fluid appeared  to be normal.  No evidence of any hemorrhage was noted.   COMPLICATIONS:  None.   CONSULTATIONS:  None.   PERTINENT LABORATORY FINDINGS:  The patient had an initial CBC performed  on admission with a white blood cell count of 10.4, hemoglobin 11.8,  hematocrit 33.2, platelets 257.  She had a quantitative beta HCG that  was 44,647.  She was typed and crossed.  Blood type was O positive.  Antibody screen was negative.  She had a DIC panel with a pro time of 13  and INR of 1, PTT 28, fibrinogen of 509.  D-dimer is 0.63.  She had a  followup complete blood count at 8 in the morning on December 28, 2006,  with a white blood cell count of 11, hemoglobin 11.4, hematocrit 32.3,  platelet count 259.  She had one more complete blood count at 12:04 in  the afternoon on November 21st with a white blood cell count of 9.8,  hemoglobin 11.5, hematocrit 32.3, platelet count 256.   BRIEF PERTINENT ADMISSION HISTORY:  This is an 20 year old gravida 1,  para 0 at 13 weeks 0 days' gestation that presents with a complaint of  vaginal bleeding.  While in the maternity admissions unit  she was noted  to have a fair amount of vaginal bleeding at that time labs were drawn  with her hemoglobin being 11.8.  DIC panel was drawn with the results  stated above.  She was given one shot of 17-P and was typed and crossed  with her blood type being O positive, antibody screen negative.  She was  admitted for observation.   HOSPITAL COURSE:  The patient was admitted and monitored closely.  Ultrasound was performed showing a viable intrauterine gestation at 13  weeks 0 days.  This is the patient's third presentation to the emergency  department with bleeding.  It was felt she should be monitored for a  short period.  She was placed on pad counts and her bleeding had slowed  to a small amount of spotting in between pad changes.  She noted no  significant bleeding similar to she was at presentation.  She did not  note any abdominal cramping.  Serial  CBCs were drawn while she was  inpatient with a stable hemoglobin of 11.5 at discharge.  She had no  complaints and physical exam was normal at the time of discharge.  She  was to be discharged in stable condition.   DISCHARGE CONDITION:  Stable.   DISCHARGE MEDICATIONS:  She is to continue home medications, prenatal  vitamins and Percocet.   DISCHARGE INSTRUCTIONS:  1. Discharged to home.  2. Regular diet.  3. The patient is to remain on bed rest with bathroom privileges until      she is seen by a physician.  4. She is to have no sexual activity or anything placed in the vagina      for the remainder of the pregnancy.  5. The patient has a followup appointment at the [facility] on January 01, 2007.  The patient has been      instructed to follow that appointment.  6. The patient is instructed to follow up at the maternity admissions      unit if her bleeding reoccurs, she develops abdominal cramping, or      has any other concerns.      Karlton Lemon, MD      Norton Blizzard, MD   Electronically Signed    NS/MEDQ  D:  12/28/2006  T:  12/28/2006  Job:  [id number]

## 2010-06-25 ENCOUNTER — Inpatient Hospital Stay (HOSPITAL_COMMUNITY)
Admission: EM | Admit: 2010-06-25 | Discharge: 2010-06-28 | DRG: 766 | Disposition: A | Discharge status: Home / Self Care | Payer: Medicaid Other | Source: Ambulatory Visit | Attending: Obstetrics & Gynecology | Admitting: Obstetrics & Gynecology

## 2010-06-25 DIAGNOSIS — O324XX0 Maternal care for high head at term, not applicable or unspecified: Secondary | ICD-10-CM | POA: Diagnosis present

## 2010-06-25 DIAGNOSIS — O900 Disruption of cesarean delivery wound: Secondary | ICD-10-CM | POA: Diagnosis not present

## 2010-06-25 DIAGNOSIS — O34219 Maternal care for unspecified type scar from previous cesarean delivery: Principal | ICD-10-CM | POA: Diagnosis present

## 2010-06-25 LAB — RAPID URINE DRUG SCREEN, HOSP PERFORMED
Barbiturates: NOT DETECTED
Benzodiazepines: NOT DETECTED

## 2010-06-25 LAB — CBC
HCT: 37.8 % (ref 36.0–46.0)
Hemoglobin: 12.7 g/dL (ref 12.0–15.0)
MCV: 93.1 fL (ref 78.0–100.0)
Platelets: 188 10*3/uL (ref 150–400)
RBC: 4.06 MIL/uL (ref 3.87–5.11)
WBC: 7.8 10*3/uL (ref 4.0–10.5)

## 2010-06-26 ENCOUNTER — Other Ambulatory Visit: Payer: Self-pay | Admitting: Obstetrics & Gynecology

## 2010-06-26 DIAGNOSIS — O324XX0 Maternal care for high head at term, not applicable or unspecified: Secondary | ICD-10-CM

## 2010-06-26 DIAGNOSIS — O34219 Maternal care for unspecified type scar from previous cesarean delivery: Secondary | ICD-10-CM

## 2010-06-26 DIAGNOSIS — O900 Disruption of cesarean delivery wound: Secondary | ICD-10-CM

## 2010-06-27 LAB — CBC
HCT: 32.1 % — ABNORMAL LOW (ref 36.0–46.0)
MCHC: 32.4 g/dL (ref 30.0–36.0)
Platelets: 162 10*3/uL (ref 150–400)
RDW: 14.4 % (ref 11.5–15.5)

## 2010-06-28 NOTE — Op Note (Signed)
Autumn Johnson, Autumn Johnson            ACCOUNT NO.:  [id number]  MEDICAL RECORD NO.:  [id number]           PATIENT TYPE:  I  LOCATION:  9147                          [facility]  PHYSICIAN:  Lazaro Arms, M.D.   DATE OF BIRTH:  1990/09/15  DATE OF PROCEDURE:  06/26/2010 DATE OF DISCHARGE:                              OPERATIVE REPORT   PREOPERATIVE DIAGNOSES: 1. Intrauterine pregnancy at 39-5/7 weeks' gestation. 2. Previous cesarean section. 3. Secondary arrest dilatation descent. 4. Failed vaginal birth after cesarean.  POSTOPERATIVE DIAGNOSES: 1. Intrauterine pregnancy at 39-5/7 weeks' gestation. 2. Previous cesarean section. 3. Secondary arrest dilatation descent. 4. Failed vaginal birth after cesarean. 5. Asymptomatic uterine dehiscence.  PROCEDURE:  A repeat cesarean section.  SURGEON:  Lazaro Arms, MD.  ANESTHESIA:  Epidural.  FINDINGS:  The patient had not progressed in hours from 5 cm.  Baby was vertex, still with a -3 station despite adequate labor, so I had to proceed with C-section which the patient agreed with.  Upon entry to the intraperitoneal cavity, she had a large area of almost like a hematoma, but it really was in a collection of blood and the left side of the uterus in the lower uterine segment and when I made the incision off, she had was a very, very thin layer of peritoneum and the uterus was opened.  So, she had a asymptomatic uterine dehiscence.  Delivered a viable female at 42 with Apgars of 9 and 9 with weight determined at nursery.  Cord gas was 7.27.  DESCRIPTION OF OPERATION:  The patient was taken to the operating room, placed supine position under epidural dosed up, prepped and draped in usual sterile fashion.  A Pfannenstiel skin incision was made, carried down sharply.  Rectus fascia was scored in midline and extended laterally.  Fascia was taken off the midline superiorly and inferiorly without difficulty.  Muscles were divided  and peritoneal cavity was entered.  Bladder blade was placed.  The above-noted findings were seen. She had a large chemotic area on the left lower uterine segment and in fact, there was essentially no lower uterine segment.  There was only a thin Saran Wrap thickness layer of peritoneum and then it was opened. So, she had a symptomatic uterine dehiscence.  Over this incision, delivered a viable female with Apgars of 9 and 9 with weight determined at nursery.  Three-vessel cord.  Cord blood and cord gas were sent. Cord pH was 7.27.  The placenta was delivered spontaneously and the uterine incision was closed in 2 layers, first being in a running interlocking layer, the second being imbricating layer and also had to place some interrupted figure-of-eight sutures for additional hemostasis.  The uterus was firm.  The ovaries and tubes were normal. The uterus was placed in peritoneal cavity and the pelvis was irrigated vigorously.  The muscles and peritoneum reapproximated loosely.  The fascia was closed using 0 Vicryl running, subcutaneous tissues made hemostatic and irrigated.  Skin was closed using skin staples.  The patient tolerated the procedure well.  She experienced 750 mL of blood loss, taken to recovery room in good,  stable condition.  All counts correct x3.     Lazaro Arms, M.D.     [name]  D:  06/26/2010  T:  06/27/2010  Job:  [id number]  Electronically Signed by Duane Lope M.D. on 06/28/2010 10:18:59 AM

## 2010-06-29 ENCOUNTER — Encounter: Payer: Self-pay | Admitting: Family Medicine

## 2010-06-30 ENCOUNTER — Ambulatory Visit (INDEPENDENT_AMBULATORY_CARE_PROVIDER_SITE_OTHER): Payer: Self-pay | Admitting: *Deleted

## 2010-06-30 DIAGNOSIS — Z4802 Encounter for removal of sutures: Secondary | ICD-10-CM

## 2010-06-30 NOTE — Progress Notes (Signed)
Patient in for staple removal following C- Section 06/26/2010. Dr. [place] came in to look at wound and advised that staples may be removed.  21 staples removed with no difficulty. Wound is intact and appears cloed .  Incision cleaned with alcohol and steri strips applied over length of incision. advised patient to notify MD if developes redness, drainage.    BP checked 110/64 pulse 82. Having some swelling of feet . Dr. [place] discussed this with patient.

## 2010-06-30 NOTE — Patient Instructions (Signed)
You can take a medicine called docusate sodium  for your bowel movements.  Take 100 mg twice a day until you have regular, soft bowel movements.  You should also try to eat fruits and vegetables and drink plenty of water.  Walking can help, too. We took out your staples today, and put some small strips of tape on the incision.  It is ok to take a shower and get them wet in the shower.  They will fall off eventually.  Usted puede tomar un medicamento llamado Docusate Sodium para los movimientos de su intestino. Tome 100 mg [employer] al da hasta que tenga movimientos intestinales regulares y [place]. Tambin debe tratar de comer frutas y verduras y beber mucha agua. Caminar puede ayudar, tambin. Sacamos las grapas de [place], y poner algunas pequeas tiras de cinta adhesiva sobre la incisin. Ud. puede banarse.  No es una problema si las cintas estan mujados. Se caern con el tiempo.

## 2010-07-05 NOTE — Progress Notes (Signed)
  Subjective:    Patient ID: Autumn Johnson, female    DOB: 22-Oct-1990, 22 y.o.   MRN: [id number]  HPI Pt here for staple removal from section 4 days ago.  Wound healing well.  Minimal c/o pain.  Has noted some mild swelling in feet.  Stopped breastfeeding due to pain. Does not want to start breastfeeding.   Pt also noted some constipation.  No BM for 4 days.  Concerned about straining to have BM.  No meds to help with this.   Review of Systems     Objective:   Physical Exam    Obese female in no distress. Abdominal incision without erythema, pus or other discharge.  Staples removed and steristrips placed by Myrlene Broker, RN without complication. Pt informed about constipation and care (see pt instructions).  F/u as needed or next scheduled appt.  Assessment & Plan:

## 2010-08-12 ENCOUNTER — Encounter: Payer: Self-pay | Admitting: Family Medicine

## 2010-08-12 ENCOUNTER — Ambulatory Visit (INDEPENDENT_AMBULATORY_CARE_PROVIDER_SITE_OTHER): Payer: Self-pay | Admitting: Family Medicine

## 2010-08-12 VITALS — BP 120/83 | HR 69 | Temp 98.2°F | Wt 187.0 lb

## 2010-08-12 DIAGNOSIS — B372 Candidiasis of skin and nail: Secondary | ICD-10-CM | POA: Insufficient documentation

## 2010-08-12 MED ORDER — NYSTATIN 100000 UNIT/GM EX POWD: Freq: Four times a day (QID) | CUTANEOUS | Status: AC

## 2010-08-12 NOTE — Patient Instructions (Signed)
It was good to see you, congratulations again.  It looks like your c-section has healed well, but that you have a yeast infection on your skin from all the heat and sweating.  I have sent a prescription for an anti-yeast powder called Nystatin, you can apply this to the area four times a day.  Try to keep the area dry.  Please call the office if it is not getting better or gets worse.   Fue bueno verte, felicidades otra vez. Parece que su cesrea se ha curado bien, pero que usted tiene una infeccin por hongos en la piel de todo el calor y la sudoracin. He enviado una receta para un polvo anti-levadura llamada nistatina, se puede aplicar esto a la zona cuatro veces al [employer]. Trate de Radio producer rea seca. Por favor, llame a la oficina si no se mejora o empeora

## 2010-08-12 NOTE — Progress Notes (Signed)
  Subjective:     Autumn Johnson is a 20 y.o. female who presents for a postpartum visit. She is 6 weeks postpartum following a low cervical transverse Cesarean section. I have fully reviewed the prenatal and intrapartum course. The delivery was at 39.5 gestational weeks. Outcome: repeat cesarean section, low transverse incision.C-section was for failure to progress. Anesthesia: epidural. Postpartum course has been without complications. Baby's course has been routine. Baby is feeding by bottle - . Bleeding staining only. Bowel function is normal. Bladder function is normal. Patient is sexually active. Contraception method is Depo-Provera injections. Postpartum depression screening: negative.  She complains that she has had some redness at her incision site, but no bleeding or drainage, the wound is closed.  She says she feels like the area stays wet because it is so hot right now.  The area is not painful but is itchy.   The patient would like a Mirena for long term birth control, she is filling out paperwork for it today.  She understands that if she does not get the Mirena placed by the time the baby is 40 months old, she needs another Depo shot.   Review of Systems Pertinent items are noted in HPI.   Objective:    BP 120/83  Pulse 69  Temp(Src) 98.2 F (36.8 C) (Oral)  Wt 187 lb (84.823 kg)  LMP 09/20/2009  General:  alert, cooperative and no distress     Lungs: clear to auscultation bilaterally  Heart:  regular rate and rhythm, S1, S2 normal, no murmur, click, rub or gallop  Abdomen: soft, non-tender; bowel sounds normal; no masses,  no organomegaly and her LTCS incsion is well healed, no open are of the wound, no drainage.  Patient's pannus covers the incision site.  She does have erythema consistent with yeast infection       Assessment:    Normal postpartum exam, Yeast infection of pannus over healed c-section incison  Plan:    1. Contraception: Depo-Provera injections and ,  plan for Mirena.  2. Rx Nystatin powder for yeast, advised keeping area dry.  3. Follow up as needed.

## 2010-09-09 ENCOUNTER — Ambulatory Visit: Payer: Self-pay | Admitting: Family Medicine

## 2010-09-22 ENCOUNTER — Ambulatory Visit (INDEPENDENT_AMBULATORY_CARE_PROVIDER_SITE_OTHER): Payer: Self-pay | Admitting: Family Medicine

## 2010-09-22 VITALS — BP 109/74 | HR 64 | Temp 97.3°F | Wt 187.0 lb

## 2010-09-22 DIAGNOSIS — Z309 Encounter for contraceptive management, unspecified: Secondary | ICD-10-CM

## 2010-09-22 MED ORDER — LEVONORGESTREL 20 MCG/24HR IU IUD
INTRAUTERINE_SYSTEM | Freq: Once | INTRAUTERINE | Status: AC
  Administered 2010-09-22: 11:00:00 via INTRAUTERINE

## 2010-09-23 NOTE — Progress Notes (Signed)
  Subjective:    Patient ID: Autumn Johnson, female    DOB: 1990-02-27, 20 y.o.   MRN: [id number]  HPI   Here for IUD placement. Review of Systems    pregnancy test negative. Objective:   Physical Exam  GU: Externally normal. Normal bimanual exam IUD INSERTION: Patient given informed consent, signed copy in the chart..  Negative pregnancy confirmed.  Appropriate time out taken.   Sterile instruments and technique was used. Cervix brought into view with use of speculum and then cleansed three times with  betadine swabs.  A tenaculum was placed into the anterior lip of the cervix and a uterine sound was used to measure uterine size.   A Mirena IUD was placed into the endometrial cavity, deployed and secured. The applicator was removed. The strings were trimmed to 2 centimeters.   There were no complications and the patient tolerated the procedure well.   She was given handouts for post procedure instructions and information about the IUD including a card with the time of recommended removal.      Assessment & Plan:

## 2010-11-07 LAB — CBC
MCHC: 34.2
MCHC: 34.4
MCHC: 34.4
MCV: 90.8
Platelets: 178
Platelets: 207
RBC: 3.06 — ABNORMAL LOW
RDW: 15.1
RDW: 15.4
WBC: 5
WBC: 9.4

## 2010-11-07 LAB — URINALYSIS, ROUTINE W REFLEX MICROSCOPIC
Bilirubin Urine: NEGATIVE
Ketones, ur: 15 — AB
Nitrite: NEGATIVE
Specific Gravity, Urine: <1.005 — ABNORMAL LOW
Urobilinogen, UA: 0.2

## 2010-11-07 LAB — RPR: RPR Ser Ql: NONREACTIVE

## 2010-11-07 LAB — INFLUENZA A+B VIRUS AG-DIRECT(RAPID): Inflenza A Ag: NEGATIVE

## 2010-11-15 LAB — CBC
HCT: 29.3 — ABNORMAL LOW
HCT: 32.3 — ABNORMAL LOW
HCT: 33.5 — ABNORMAL LOW
Hemoglobin: 11.4 — ABNORMAL LOW
MCHC: 35.3
MCV: 90.4
MCV: 91.3
Platelets: 255
Platelets: 256
Platelets: 257
Platelets: 259
Platelets: 269
RBC: 3.58 — ABNORMAL LOW
RBC: 3.69 — ABNORMAL LOW
RBC: 4.02
RDW: 13.4
RDW: 13.6
RDW: 13.6
WBC: 11 — ABNORMAL HIGH
WBC: 15.9 — ABNORMAL HIGH
WBC: 9.8

## 2010-11-15 LAB — DIFFERENTIAL
Basophils Relative: 0
Eosinophils Relative: 0
Eosinophils Relative: 0
Lymphocytes Relative: 12
Lymphs Abs: 1.7
Lymphs Abs: 2
Monocytes Relative: 2 — ABNORMAL LOW
Monocytes Relative: 3
Neutro Abs: 16.5 — ABNORMAL HIGH
Neutrophils Relative %: 84 — ABNORMAL HIGH

## 2010-11-15 LAB — URINALYSIS, ROUTINE W REFLEX MICROSCOPIC
Glucose, UA: NEGATIVE
Ketones, ur: NEGATIVE
Leukocytes, UA: NEGATIVE
Nitrite: NEGATIVE
Protein, ur: 100 — AB
Protein, ur: NEGATIVE
Urobilinogen, UA: 0.2
pH: 6.5
pH: 7

## 2010-11-15 LAB — DIC (DISSEMINATED INTRAVASCULAR COAGULATION)PANEL
Platelets: 248
Smear Review: NONE SEEN

## 2010-11-15 LAB — WET PREP, GENITAL
Clue Cells Wet Prep HPF POC: NONE SEEN
Trich, Wet Prep: NONE SEEN

## 2010-11-15 LAB — GC/CHLAMYDIA PROBE AMP, GENITAL: GC Probe Amp, Genital: NEGATIVE

## 2010-11-15 LAB — URINE MICROSCOPIC-ADD ON

## 2010-11-15 LAB — CROSSMATCH
ABO/RH(D): O POS
Antibody Screen: NEGATIVE

## 2010-11-15 LAB — CULTURE, BLOOD (ROUTINE X 2)

## 2010-11-15 LAB — HCG, QUANTITATIVE, PREGNANCY
hCG, Beta Chain, Quant, S: 44647 — ABNORMAL HIGH
hCG, Beta Chain, Quant, S: 48446 — ABNORMAL HIGH

## 2010-11-22 ENCOUNTER — Encounter (HOSPITAL_COMMUNITY): Payer: Self-pay | Admitting: *Deleted

## 2011-09-15 ENCOUNTER — Other Ambulatory Visit: Payer: Self-pay | Admitting: Family Medicine

## 2011-09-15 ENCOUNTER — Encounter: Payer: Self-pay | Admitting: Family Medicine

## 2011-09-15 DIAGNOSIS — L301 Dyshidrosis [pompholyx]: Secondary | ICD-10-CM | POA: Insufficient documentation

## 2011-09-15 MED ORDER — TRIAMCINOLONE ACETONIDE 0.1 % EX CREA: TOPICAL_CREAM | Freq: Two times a day (BID) | CUTANEOUS | Status: DC

## 2011-09-15 NOTE — Progress Notes (Signed)
  Subjective:    Patient ID: Autumn Johnson, female    DOB: 21-Jun-1990, 21 y.o.   MRN: [id number]  HPI Comes in today with daughter; mentions intense itch and vesicles across fingers of left hand.  Worse in digits 3-5.   Review of Systems     Objective:   Physical Exam  Well appearing, no apparent distress.  L HAND WITH vesicular lesions excoriations c/w dishidrotic eczema.      Assessment & Plan:  Plan to treat with topical steroid.  See Orders Only entry. Paula Compton, MD

## 2012-07-24 ENCOUNTER — Emergency Department (HOSPITAL_COMMUNITY)
Admission: EM | Admit: 2012-07-24 | Discharge: 2012-07-24 | Disposition: A | Discharge status: Home / Self Care | Payer: Self-pay | Attending: Emergency Medicine | Admitting: Emergency Medicine

## 2012-07-24 ENCOUNTER — Encounter (HOSPITAL_COMMUNITY): Payer: Self-pay | Admitting: *Deleted

## 2012-07-24 DIAGNOSIS — T7840XA Allergy, unspecified, initial encounter: Secondary | ICD-10-CM

## 2012-07-24 DIAGNOSIS — L259 Unspecified contact dermatitis, unspecified cause: Secondary | ICD-10-CM

## 2012-07-24 DIAGNOSIS — L255 Unspecified contact dermatitis due to plants, except food: Secondary | ICD-10-CM | POA: Insufficient documentation

## 2012-07-24 DIAGNOSIS — L299 Pruritus, unspecified: Secondary | ICD-10-CM | POA: Insufficient documentation

## 2012-07-24 MED ORDER — HYDROCORTISONE 1 % EX CREA: TOPICAL_CREAM | CUTANEOUS | Status: DC

## 2012-07-24 MED ORDER — DEXAMETHASONE SODIUM PHOSPHATE 10 MG/ML IJ SOLN
10.0000 mg | Freq: Once | INTRAMUSCULAR | Status: AC
  Administered 2012-07-24: 10 mg via INTRAMUSCULAR
  Filled 2012-07-24: qty 1

## 2012-07-24 MED ORDER — PREDNISONE 10 MG PO TABS: 20.0000 mg | ORAL_TABLET | Freq: Every day | ORAL | Status: DC

## 2012-07-24 MED ORDER — DIPHENHYDRAMINE HCL 50 MG/ML IJ SOLN
25.0000 mg | Freq: Once | INTRAMUSCULAR | Status: AC
  Administered 2012-07-24: 25 mg via INTRAMUSCULAR
  Filled 2012-07-24: qty 1

## 2012-07-24 MED ORDER — CALAMINE EX LOTN: TOPICAL_LOTION | CUTANEOUS | Status: DC | PRN

## 2012-07-24 NOTE — ED Notes (Signed)
The pt has a rash over her body for 2 weeks she has been in poison ivy

## 2012-07-24 NOTE — ED Provider Notes (Signed)
History     CSN: [id number]  Arrival date & time 07/24/12  2026   First MD Initiated Contact with Patient 07/24/12 2124      Chief Complaint  Patient presents with  . Rash   HPI  History provided by patient and significant other. Patient is a 22 year old female with no significant PMH who presents with complaints of persistent pruritic rash over her body. Symptoms first began 2 weeks ago with some rash and itching to her bilateral wrists and arms. She also had some rash and itching to her chest and abdomen area. Patient does report being outside several times in the past few weeks doing yard work and may have been in contact with poison ivy. She has been taking Benadryl for the itching symptoms without significant relief. She also purchased an over-the-counter motion to help with the symptoms. Symptoms have been persistent and very pruritic. She denies any pain from the skin. Denies any fever, chills or sweats. Denies any swelling of the mouth. No difficulty breathing or swallowing. She denies any new soaps, body lotions, detergents or clothing. No other aggravating or alleviating factors. No other associated symptoms.    History reviewed. No pertinent past medical history.  Past Surgical History  Procedure Laterality Date  . Cesarean section      Family History  Problem Relation Age of Onset  . Diabetes Mother     Pre-diabetes  . Hypertension Mother   . Hyperlipidemia Sister     History  Substance Use Topics  . Smoking status: Never Smoker   . Smokeless tobacco: Never Used  . Alcohol Use: No    OB History   Grav Para Term Preterm Abortions TAB SAB Ect Mult Living   3 2 2  1  1   1       Review of Systems  Constitutional: Negative for fever, chills and diaphoresis.  Respiratory: Negative for shortness of breath.   Cardiovascular: Negative for chest pain.  Skin: Positive for rash.  All other systems reviewed and are negative.    Allergies  Review of patient's  allergies indicates no known allergies.  Home Medications   Current Outpatient Rx  Name  Route  Sig  Dispense  Refill  . DiphenhydrAMINE HCl (BENADRYL PO)   Oral   Take 2 tablets by mouth every 6 (six) hours as needed (allergies).           BP 114/61  Pulse 80  Temp(Src) 98.4 F (36.9 C) (Oral)  Resp 16  SpO2 100%  Physical Exam  Nursing note and vitals reviewed. Constitutional: She is oriented to person, place, and time. She appears well-developed and well-nourished. No distress.  HENT:  Head: Normocephalic.  Mouth/Throat: Oropharynx is clear and moist.  Cardiovascular: Normal rate and regular rhythm.   Pulmonary/Chest: Effort normal and breath sounds normal. No respiratory distress. She has no wheezes. She has no rales.  Abdominal: Soft. There is no tenderness. There is no rebound and no guarding.  Musculoskeletal: Normal range of motion.  Neurological: She is alert and oriented to person, place, and time.  Skin: Skin is warm and dry. Rash noted.  Multiple areas of excoriations to the skin on the arms. There are some urticarial type lesions with erythemathe bilateral upper extremities, chest abdomen and legs. Large confluent area of erythema to the right lower abdomen and flank. No induration.  Psychiatric: She has a normal mood and affect. Her behavior is normal.    ED Course  Procedures  1. Contact dermatitis   2. Allergic reaction, initial encounter       MDM  9:45PM patient seen and evaluated. Patient appears well but is scratching at her upper arms.      Angus Seller, PA-C 07/24/12 2215

## 2012-07-25 NOTE — ED Provider Notes (Signed)
Medical screening examination/treatment/procedure(s) were performed by non-physician practitioner and as supervising physician I was immediately available for consultation/collaboration.  Juliet Rude. Rubin Payor, MD 07/25/12 2321

## 2012-08-12 ENCOUNTER — Encounter (HOSPITAL_COMMUNITY): Payer: Self-pay | Admitting: *Deleted

## 2012-08-12 ENCOUNTER — Emergency Department (HOSPITAL_COMMUNITY)
Admission: EM | Admit: 2012-08-12 | Discharge: 2012-08-12 | Disposition: A | Discharge status: Home / Self Care | Payer: Self-pay | Attending: Emergency Medicine | Admitting: Emergency Medicine

## 2012-08-12 ENCOUNTER — Emergency Department (HOSPITAL_COMMUNITY): Payer: Self-pay

## 2012-08-12 DIAGNOSIS — M940 Chondrocostal junction syndrome [Tietze]: Secondary | ICD-10-CM | POA: Insufficient documentation

## 2012-08-12 DIAGNOSIS — Z3202 Encounter for pregnancy test, result negative: Secondary | ICD-10-CM | POA: Insufficient documentation

## 2012-08-12 DIAGNOSIS — R0989 Other specified symptoms and signs involving the circulatory and respiratory systems: Secondary | ICD-10-CM | POA: Insufficient documentation

## 2012-08-12 DIAGNOSIS — R0789 Other chest pain: Secondary | ICD-10-CM

## 2012-08-12 DIAGNOSIS — R071 Chest pain on breathing: Secondary | ICD-10-CM | POA: Insufficient documentation

## 2012-08-12 DIAGNOSIS — R0609 Other forms of dyspnea: Secondary | ICD-10-CM | POA: Insufficient documentation

## 2012-08-12 DIAGNOSIS — R0602 Shortness of breath: Secondary | ICD-10-CM | POA: Insufficient documentation

## 2012-08-12 LAB — BASIC METABOLIC PANEL
BUN: 12 mg/dL (ref 6–23)
Calcium: 9.5 mg/dL (ref 8.4–10.5)
GFR calc non Af Amer: >90 mL/min (ref >90)
Glucose, Bld: 96 mg/dL (ref 70–99)
Potassium: 3.9 mEq/L (ref 3.5–5.1)

## 2012-08-12 LAB — CBC
HCT: 39.3 % (ref 36.0–46.0)
Hemoglobin: 13.8 g/dL (ref 12.0–15.0)
MCH: 32.2 pg (ref 26.0–34.0)
MCHC: 35.1 g/dL (ref 30.0–36.0)
MCV: 91.8 fL (ref 78.0–100.0)

## 2012-08-12 LAB — POCT I-STAT TROPONIN I

## 2012-08-12 MED ORDER — IBUPROFEN 600 MG PO TABS: 600.0000 mg | ORAL_TABLET | Freq: Three times a day (TID) | ORAL | Status: DC | PRN

## 2012-08-12 NOTE — ED Provider Notes (Addendum)
Complains of right-sided chest pain knifelike intermittent for the past 4 days. Pain nonradiating he is asymptomatic as I examine her. Exam no distress lungs clear auscultation regular rate and rhythm abdomen mildly obese nontender extremities without edema History was obtained by [place] language line from medical interpreter Doug Sou, MD 08/12/12 1901  Doug Sou, MD 08/12/12 1925

## 2012-08-12 NOTE — ED Notes (Signed)
Pt also reports headache

## 2012-08-12 NOTE — ED Notes (Signed)
Pt undressed, in gown, on monitor, continuous pulse oximetry and blood pressure cuff; family at bedside 

## 2012-08-12 NOTE — ED Notes (Signed)
Pt has returned back from being out of the department; pt placed back on monitor, continuous pulse oximetry and blood pressure cuff

## 2012-08-12 NOTE — ED Notes (Signed)
Chaperoned Resident MD while he performed a breat examination on pt

## 2012-08-12 NOTE — ED Notes (Signed)
Per spanish interpretor:  It has been a few days with chest pain that is more so on the right and states the pain is sharp.  Pt feels fatigued and pain when she breath breath.  Pt reports back pain too.

## 2012-08-12 NOTE — ED Provider Notes (Signed)
History    CSN: [id number] Arrival date & time 08/12/12  1430  First MD Initiated Contact with Patient 08/12/12 1647     No chief complaint on file.  (Consider location/radiation/quality/duration/timing/severity/associated sxs/prior Treatment) HPI Comments: Pt w/ no PMHX now w/ 1 wk of intermittent sharp chest pain. States pleuritic, non radiating a/w minimal dyspnea. Not exertional. Initially right sided chest, now substernal. Ttp, no recent cough or fever. No hx of DVT/PE. Is on birth control. No hx of trauma, CAD or sudden cardiac death in family.  Patient is a 22 y.o. female presenting with general illness. The history is provided by the patient. No language interpreter was used.  Illness Location:  Cardio/pulm Quality:  Chest pain, dyspnea Severity:  Moderate Onset quality:  Gradual Duration:  1 week Timing:  Intermittent Progression:  Worsening Chronicity:  New Associated symptoms: chest pain and shortness of breath   Associated symptoms: no abdominal pain, no congestion, no cough, no diarrhea, no fever, no headaches, no nausea, no rash, no sore throat and no vomiting    History reviewed. No pertinent past medical history. Past Surgical History  Procedure Laterality Date  . Cesarean section     Family History  Problem Relation Age of Onset  . Diabetes Mother     Pre-diabetes  . Hypertension Mother   . Hyperlipidemia Sister    History  Substance Use Topics  . Smoking status: Never Smoker   . Smokeless tobacco: Never Used  . Alcohol Use: No   OB History   Grav Para Term Preterm Abortions TAB SAB Ect Mult Living   3 2 2  1  1   1      Review of Systems  Constitutional: Negative for fever and chills.  HENT: Negative for congestion and sore throat.   Respiratory: Positive for shortness of breath. Negative for cough.   Cardiovascular: Positive for chest pain. Negative for leg swelling.  Gastrointestinal: Negative for nausea, vomiting, abdominal pain, diarrhea and  constipation.  Genitourinary: Negative for dysuria and frequency.  Skin: Negative for color change and rash.  Neurological: Negative for dizziness and headaches.  Psychiatric/Behavioral: Negative for confusion and agitation.  All other systems reviewed and are negative.    Allergies  Review of patient's allergies indicates no known allergies.  Home Medications   Current Outpatient Rx  Name  Route  Sig  Dispense  Refill  . calamine lotion   Topical   Apply 1 application topically as needed.          BP 105/77  Pulse 88  Temp(Src) 98.2 F (36.8 C) (Oral)  Resp 18  SpO2 99% Physical Exam  Vitals reviewed. Constitutional: She is oriented to person, place, and time. She appears well-developed and well-nourished. No distress.  HENT:  Head: Normocephalic and atraumatic.  Eyes: EOM are normal. Pupils are equal, round, and reactive to light.  Neck: Normal range of motion. Neck supple.  Cardiovascular: Normal rate and regular rhythm.     Pulmonary/Chest: Effort normal. No respiratory distress. She has no decreased breath sounds.  Abdominal: Soft. She exhibits no distension.  Musculoskeletal: Normal range of motion. She exhibits no edema.  Neurological: She is alert and oriented to person, place, and time.  Skin: Skin is warm and dry.  Psychiatric: She has a normal mood and affect. Her behavior is normal.    ED Course  Procedures (including critical care time) Labs Reviewed  CBC  BASIC METABOLIC PANEL  POCT PREGNANCY, URINE  POCT I-STAT TROPONIN I  Results for orders placed during the hospital encounter of 08/12/12  CBC      Result Value Range   WBC 6.6  4.0 - 10.5 K/uL   RBC 4.28  3.87 - 5.11 MIL/uL   Hemoglobin 13.8  12.0 - 15.0 g/dL   HCT 78.4  69.6 - 29.5 %   MCV 91.8  78.0 - 100.0 fL   MCH 32.2  26.0 - 34.0 pg   MCHC 35.1  30.0 - 36.0 g/dL   RDW 28.4  13.2 - 44.0 %   Platelets 243  150 - 400 K/uL  BASIC METABOLIC PANEL      Result Value Range   Sodium  137  135 - 145 mEq/L   Potassium 3.9  3.5 - 5.1 mEq/L   Chloride 100  96 - 112 mEq/L   CO2 26  19 - 32 mEq/L   Glucose, Bld 96  70 - 99 mg/dL   BUN 12  6 - 23 mg/dL   Creatinine, Ser 1.02  0.50 - 1.10 mg/dL   Calcium 9.5  8.4 - 72.5 mg/dL   GFR calc non Af Amer >90  >90 mL/min   GFR calc Af Amer >90  >90 mL/min  D-DIMER, QUANTITATIVE      Result Value Range   D-Dimer, Quant 0.44  0.00 - 0.48 ug/mL-FEU  POCT PREGNANCY, URINE      Result Value Range   Preg Test, Ur NEGATIVE  NEGATIVE  POCT I-STAT TROPONIN I      Result Value Range   Troponin i, poc 0.00  0.00 - 0.08 ng/mL   Comment 3                 DG Chest 2 View (Final result)  Result time: 08/12/12 17:58:47    Final result by Rad Results In Interface (08/12/12 17:58:47)    Narrative:   *RADIOLOGY REPORT*  Clinical Data: Chest pain.  CHEST - 2 VIEW  Comparison: 11/28/2007  Findings: Mild peribronchial thickening. Heart is normal size. There are low lung volumes. No confluent airspace opacities. No effusions. No acute bony abnormality.  IMPRESSION: Mild bronchitic changes.   Original Report Authenticated By: Charlett Nose, M.D.            Date: 08/12/2012  Rate: 91  Rhythm: normal sinus rhythm  QRS Axis: normal  Intervals: normal  ST/T Wave abnormalities: normal  Conduction Disutrbances:none  Narrative Interpretation:   Old EKG Reviewed: none available   No results found. No diagnosis found.  MDM  Exam as above, chest wall ttp - same pain she has been feeling, ECG - NSR - no acute ischemia. CXR - NACPF, troponin neg, no anemia. D. Dimer negative, hcg neg, labs otherwise unremarkable. At this time doubt ACS, PE (low risk wells and PERC + for BCP  D. Dimer neg), tamponade, dissection, spont ptx. Likely costochondritis. Recommend motrin TID, avoid heavy lifting and fup w/ pcp in 1 wk if sx no improving. Stable for d/c home. Given return precautions. Case d/w Dr Ethelda Chick.   I have personally reviewed  labs and imaging and considered in my MDM.   1. Costochondritis, acute   2. Chest wall pain    New Prescriptions   IBUPROFEN (ADVIL,MOTRIN) 600 MG TABLET    Take 1 tablet (600 mg total) by mouth every 8 (eight) hours as needed for pain.   Wenda Low, MD [address] [place] [address] [phone]  Schedule an appointment as soon as possible for a  visit on 08/16/2012     Audelia Hives, MD 08/13/12 1610  Audelia Hives, MD 08/13/12 0040

## 2012-08-13 NOTE — ED Provider Notes (Signed)
I have personally seen and examined the patient.  I have discussed the plan of care with the resident.  I have reviewed the documentation on PMH/FH/Soc. History.  I have reviewed the documentation of the resident and agree.  Terisa Belardo, MD 08/13/12 1507 

## 2012-08-27 ENCOUNTER — Ambulatory Visit (INDEPENDENT_AMBULATORY_CARE_PROVIDER_SITE_OTHER): Payer: Self-pay | Admitting: Family Medicine

## 2012-08-27 ENCOUNTER — Encounter: Payer: Self-pay | Admitting: Family Medicine

## 2012-08-27 VITALS — BP 124/46 | HR 90 | Temp 99.0°F | Ht 59.0 in | Wt 192.0 lb

## 2012-08-27 DIAGNOSIS — R5381 Other malaise: Secondary | ICD-10-CM | POA: Insufficient documentation

## 2012-08-27 DIAGNOSIS — M940 Chondrocostal junction syndrome [Tietze]: Secondary | ICD-10-CM

## 2012-08-27 DIAGNOSIS — R5383 Other fatigue: Secondary | ICD-10-CM

## 2012-08-27 NOTE — Patient Instructions (Addendum)
It was great seeing you today. Below is a list of the things we talked about:   1) We will check your thyroid hormone today, and you will be notified of the results  Please check-out at the front desk before leaving the clinic.  I look forward to talking with you again at our next visit. If you have any questions or concerns before then, please call the clinic at [phone].  See you soon,   Dr Wenda Low

## 2012-08-27 NOTE — Progress Notes (Signed)
[facility]  Patient name: Autumn Johnson MRN [id number]  Date of birth: 12-10-1990  CC & HPI:  Autumn Johnson is a 22 y.o. female presenting today for ED follow-up.   She reports going to the ED for Right-sided, sharp stabbing chest wall pain and Rt scapular pain.  - EKG and CXR were wnl - Dx of Costochondritis and NSAIDs Rx given - Pain is completely resolved now - Associated: 10 lbs weight gain in the last week (per pt) and inc menstrual flow, URI symptoms and breast tenderness - ED preg test neg ROS: Neg SOB, LLE, Rash  ROS:  Please See HPI above   Pertinent History Reviewed:  Medical & Surgical Hx:  Reviewed:  Medications & Allergies: Reviewed & Updated - see associated section  Social History: Reviewed:   reports that she has never smoked. She has never used smokeless tobacco.  History  Alcohol Use No   History  Drug Use No    Objective Findings:  Vitals: BP 124/46  Pulse 90  Temp(Src) 99 F (37.2 C) (Oral)  Ht 4\' 11"  (1.499 m)  Wt 192 lb (87.091 kg)  BMI 38.76 kg/m2  LMP 08/09/2012 CURRENT WEIGHT: @WTENG @  BMI: @BMI1 @   Gen: NAD Neck: Supple & nontender; Trachea midline; Thyroid normal; No lymph nodes palpated  CV: RRR w/o m/r/g Resp: CTAB w/ normal respiratory effort Back: Normal skin w/o rash, Spine with normal alignment and no deformity. No tenderness to vertebral process palpation.  Paraspinous muscles are nontender and without spasm.   Range of motion is full at neck and lumbar sacral regions   Assessment & Plan:   Please See Problem Focused Assessment & Plan

## 2012-08-27 NOTE — Assessment & Plan Note (Signed)
Reports 10 lbs wight gain over past week with inc menstrual flow, fatigue and URI symptoms  1) Check TSH today 2) Discussed Diet & Exercise as mean to prevent Diabetes, HTN, HLD for which she was concerned

## 2012-08-28 ENCOUNTER — Encounter: Payer: Self-pay | Admitting: Family Medicine

## 2012-10-03 ENCOUNTER — Ambulatory Visit: Payer: Self-pay

## 2013-02-13 ENCOUNTER — Encounter (HOSPITAL_COMMUNITY): Payer: Self-pay | Admitting: Emergency Medicine

## 2013-02-13 ENCOUNTER — Emergency Department (HOSPITAL_COMMUNITY)
Admission: EM | Admit: 2013-02-13 | Discharge: 2013-02-13 | Disposition: A | Discharge status: Home / Self Care | Payer: Self-pay | Attending: Emergency Medicine | Admitting: Emergency Medicine

## 2013-02-13 ENCOUNTER — Emergency Department (HOSPITAL_COMMUNITY): Payer: Self-pay

## 2013-02-13 DIAGNOSIS — K219 Gastro-esophageal reflux disease without esophagitis: Secondary | ICD-10-CM | POA: Insufficient documentation

## 2013-02-13 DIAGNOSIS — Z3202 Encounter for pregnancy test, result negative: Secondary | ICD-10-CM | POA: Insufficient documentation

## 2013-02-13 DIAGNOSIS — R110 Nausea: Secondary | ICD-10-CM | POA: Insufficient documentation

## 2013-02-13 DIAGNOSIS — R109 Unspecified abdominal pain: Secondary | ICD-10-CM

## 2013-02-13 DIAGNOSIS — R1013 Epigastric pain: Secondary | ICD-10-CM | POA: Insufficient documentation

## 2013-02-13 LAB — COMPREHENSIVE METABOLIC PANEL
ALK PHOS: 60 U/L (ref 39–117)
ALT: 20 U/L (ref 0–35)
AST: 18 U/L (ref 0–37)
Albumin: 4 g/dL (ref 3.5–5.2)
BUN: 14 mg/dL (ref 6–23)
CHLORIDE: 100 meq/L (ref 96–112)
CO2: 26 meq/L (ref 19–32)
Calcium: 9 mg/dL (ref 8.4–10.5)
Creatinine, Ser: 0.53 mg/dL (ref 0.50–1.10)
GFR calc non Af Amer: >90 mL/min (ref >90)
GLUCOSE: 88 mg/dL (ref 70–99)
POTASSIUM: 4 meq/L (ref 3.7–5.3)
SODIUM: 137 meq/L (ref 137–147)
TOTAL PROTEIN: 7.8 g/dL (ref 6.0–8.3)
Total Bilirubin: 0.4 mg/dL (ref 0.3–1.2)

## 2013-02-13 LAB — CBC WITH DIFFERENTIAL/PLATELET
Basophils Absolute: 0 10*3/uL (ref 0.0–0.1)
Basophils Relative: 0 % (ref 0–1)
EOS ABS: 0.1 10*3/uL (ref 0.0–0.7)
Eosinophils Relative: 2 % (ref 0–5)
HCT: 38.8 % (ref 36.0–46.0)
Hemoglobin: 13.2 g/dL (ref 12.0–15.0)
LYMPHS ABS: 2.5 10*3/uL (ref 0.7–4.0)
LYMPHS PCT: 35 % (ref 12–46)
MCH: 31.9 pg (ref 26.0–34.0)
MCHC: 34 g/dL (ref 30.0–36.0)
MCV: 93.7 fL (ref 78.0–100.0)
Monocytes Absolute: 0.2 10*3/uL (ref 0.1–1.0)
Monocytes Relative: 3 % (ref 3–12)
NEUTROS ABS: 4.3 10*3/uL (ref 1.7–7.7)
NEUTROS PCT: 60 % (ref 43–77)
PLATELETS: 230 10*3/uL (ref 150–400)
RBC: 4.14 MIL/uL (ref 3.87–5.11)
RDW: 12.9 % (ref 11.5–15.5)
WBC: 7.1 10*3/uL (ref 4.0–10.5)

## 2013-02-13 LAB — POCT PREGNANCY, URINE: Preg Test, Ur: NEGATIVE

## 2013-02-13 LAB — LIPASE, BLOOD: Lipase: 26 U/L (ref 11–59)

## 2013-02-13 MED ORDER — GI COCKTAIL ~~LOC~~
30.0000 mL | Freq: Once | ORAL | Status: AC
  Administered 2013-02-13: 30 mL via ORAL
  Filled 2013-02-13: qty 30

## 2013-02-13 MED ORDER — FAMOTIDINE 20 MG PO TABS
40.0000 mg | ORAL_TABLET | Freq: Once | ORAL | Status: AC
  Administered 2013-02-13: 40 mg via ORAL
  Filled 2013-02-13: qty 2

## 2013-02-13 MED ORDER — PANTOPRAZOLE SODIUM 20 MG PO TBEC: 20.0000 mg | DELAYED_RELEASE_TABLET | Freq: Every day | ORAL | Status: DC

## 2013-02-13 MED ORDER — SUCRALFATE 1 G PO TABS: 1.0000 g | ORAL_TABLET | Freq: Four times a day (QID) | ORAL | Status: DC

## 2013-02-13 NOTE — ED Provider Notes (Signed)
CSN: [id number]     Arrival date & time 02/13/13  1517 History   First MD Initiated Contact with Patient 02/13/13 1831     Chief Complaint  Patient presents with  . Abdominal Pain  . Nausea   (Consider location/radiation/quality/duration/timing/severity/associated sxs/prior Treatment) Patient is a 23 y.o. female presenting with abdominal pain. The history is provided by the patient.  Abdominal Pain  patient here complaining of a one-week history of epigastric pain radiating to her back that is worse with eating. No fever or chills. She's had nausea but no vomiting. No urinary symptoms. Denies any vaginal bleeding or discharge. Symptoms have been persistent and nothing makes them better. No treatment used prior to arrival.  History reviewed. No pertinent past medical history. Past Surgical History  Procedure Laterality Date  . Cesarean section     Family History  Problem Relation Age of Onset  . Diabetes Mother     Pre-diabetes  . Hypertension Mother   . Hyperlipidemia Sister    History  Substance Use Topics  . Smoking status: Never Smoker   . Smokeless tobacco: Never Used  . Alcohol Use: No   OB History   Grav Para Term Preterm Abortions TAB SAB Ect Mult Living   3 2 2  1  1   1      Review of Systems  Gastrointestinal: Positive for abdominal pain.  All other systems reviewed and are negative.    Allergies  Review of patient's allergies indicates no known allergies.  Home Medications   Current Outpatient Rx  Name  Route  Sig  Dispense  Refill  . IBUPROFEN PO   Oral   Take 1 tablet by mouth daily as needed (pain).          BP 119/72  Pulse 70  Temp(Src) 97.2 F (36.2 C) (Oral)  Resp 20  Wt 210 lb (95.255 kg)  SpO2 98% Physical Exam  Nursing note and vitals reviewed. Constitutional: She is oriented to person, place, and time. She appears well-developed and well-nourished.  Non-toxic appearance. No distress.  HENT:  Head: Normocephalic and atraumatic.   Eyes: Conjunctivae, EOM and lids are normal. Pupils are equal, round, and reactive to light.  Neck: Normal range of motion. Neck supple. No tracheal deviation present. No mass present.  Cardiovascular: Normal rate, regular rhythm and normal heart sounds.  Exam reveals no gallop.   No murmur heard. Pulmonary/Chest: Effort normal and breath sounds normal. No stridor. No respiratory distress. She has no decreased breath sounds. She has no wheezes. She has no rhonchi. She has no rales.  Abdominal: Soft. Normal appearance and bowel sounds are normal. She exhibits no distension. There is tenderness in the right upper quadrant and epigastric area. There is no rigidity, no rebound, no guarding and no CVA tenderness.  Musculoskeletal: Normal range of motion. She exhibits no edema and no tenderness.  Neurological: She is alert and oriented to person, place, and time. She has normal strength. No cranial nerve deficit or sensory deficit. GCS eye subscore is 4. GCS verbal subscore is 5. GCS motor subscore is 6.  Skin: Skin is warm and dry. No abrasion and no rash noted.  Psychiatric: She has a normal mood and affect. Her speech is normal and behavior is normal.    ED Course  Procedures (including critical care time) Labs Review Labs Reviewed  CBC WITH DIFFERENTIAL  COMPREHENSIVE METABOLIC PANEL  LIPASE, BLOOD   Imaging Review No results found.  EKG Interpretation   None  MDM  No diagnosis found.  Patient with negative labs an abdominal ultrasound here. Suspect that she has GERD. Given GI cocktail and Pepcid and feels better. She is stable for discharge   Toy BakerAnthony T Lacorey Brusca, MD 02/13/13 2115

## 2013-02-13 NOTE — ED Notes (Signed)
Patient taken to US.

## 2013-02-13 NOTE — ED Notes (Signed)
Patient transported to Ultrasound 

## 2013-02-13 NOTE — ED Notes (Addendum)
PT c/o epigastric pain that radiates to back accompanied by nausea since Sat.  Pt feels as if "something is coming up her throat that is not exactly heartburn, but is similar".  Translator phone used.

## 2013-02-13 NOTE — Discharge Instructions (Signed)
Dolor abdominal  (Abdominal Pain)  El dolor abdominal o dolor en el estmago puede ser causado por muchos factores. El mdico decidir la gravedad de la causa dolor por medio de un examen fsico y Landscape architectle solicitar pruebas y Recruitment consultantradiografas. Muchos de estos casos pueden controlarse y tratarse en casa. La mayor parte de los dolores en la zona abdominal que padecen los nios es funcional. Esto significa que no  est originado en ninguna enfermedad y que probablemente mejorar sin tratamiento.  INSTRUCCIONES PARA EL CUIDADO EN EL HOGAR  No tome ni administre laxantes a menos que se lo haya indicado el mdico.  Utilice los medicamentos de venta libre o de prescripcin para Chief Technology Officerel dolor, el Dentistmalestar o la Montrealfiebre, segn se lo indique el mdico.  Tome medicacin para el alivio del dolor slo si se lo ha indicado el profesional.  Consuma una dieta lquida: caldo, t o agua el tiempo que se lo indique su mdico. Luego podr gradualmente consumir una dieta blanda segn lo que tolere cada Vancepaciente. SOLICITE ATENCIN MDICA DE INMEDIATO SI:  El dolor persiste.  Tiene fiebre.  Presenta vmitos repetidas veces.  Siente el dolor slo en algunos sectores del abdomen (vientre). Si se localiza en la zona derecha, posiblemente podra tratarse de apendicitis. En un adulto, si se localiza en la regin inferior izquierda del abdomen, podra tratarse de colitis o diverticulitis.  Hay sangre en las heces (deposiciones de color rojo brillante o negro alquitranado). ASEGRESE DE QUE:   Comprende las instrucciones para el alta mdica.  Controlar su enfermedad.  Solicitar atencin mdica de inmediato segn las indicaciones. Document Released: 01/23/2005 Document Revised: 07/25/2011 [facility] Patient Information 2014 [place], [place]. Reflujo gastroesofgico - Adultos  (Gastroesophageal Reflux Disease, Adult)  El reflujo gastroesofgico ocurre cuando el cido del estmago pasa al esfago. Cuando el cido entra en contacto  con el esfago, el cido provoca dolor (inflamacin) en el esfago. Con el tiempo, pueden formarse pequeos agujeros (lceras) en el revestimiento del esfago. CAUSAS   Exceso de Runner, broadcasting/film/videopeso corporal. Esto aplica presin Eli Lilly and Companysobre el estmago, lo que hace que el cido del estmago suba hacia el esfago.  El hbito de fumar Aumenta la produccin de cido en el McGrewestmago.  El consumo de alcohol. Provoca disminucin de la presin en el esfnter esofgico inferior (vlvula o anillo de msculo entre el esfago y Investment banker, corporateel estmago), permitiendo que el cido del estmago suba hacia el esfago.  Cenas a ltima hora del da y estmago lleno. Aumenta la presin y la produccin de cido en el estmago.  Malformacin en el esfnter esofgico inferior. A menudo no se halla causa.  SNTOMAS   Ardor y Radiographer, therapeuticdolor en la parte inferior del pecho detrs del esternn y en la zona media del Spiveyestmago. Puede ocurrir Toys 'R' Usdos veces por semana o ms a menudo.  Dificultad para tragar.  Dolor de Advertising copywritergarganta.  Tos seca.  Sntomas similares al asma que incluyen sensacin de opresin en el pecho, falta de aire y sibilancias. DIAGNSTICO  El mdico diagnosticar el problema basndose en los sntomas. En algunos casos, se indican radiografas y otras pruebas para verificar si hay complicaciones o para comprobar el estado del [address] Hospital [address]est[address] y Training and development officerel esfago.  TRATAMIENTO  El mdico le indicar medicamentos de venta libre o recetados para ayudar a disminuir la produccin de cido. Consulte con su mdico antes de Corporate investment bankerempezar o agregar cualquier medicamento nuevo.  INSTRUCCIONES PARA EL CUIDADO EN EL HOGAR   Modifique los factores que pueda cambiar. Consulte con su mdico para solicitar  orientacin relacionada con la prdida de peso, dejar de fumar y el consumo de alcohol.  Evite las comidas y bebidas que [address] [place], [place]:  [place] con cafena o alcohlicas.  Chocolate.  Sabores a Advertising account planner.  Ajo y cebolla.  Comidas muy condimentadas.  Ctricos  como naranjas, limones o limas.  Alimentos que contengan tomate, como salsas, [place] y pizza.  Alimentos fritos y Lexicographer.  Evite acostarse durante 3 horas antes de irse a dormir o antes de tomar una siesta.  Haga comidas pequeas durante Glass blower/designer de 3 comidas abundantes.  Use ropas sueltas. No use nada apretado alrededor de la cintura que cause presin en el estmago.  Levante (eleve) la cabecera de la cama 6 a 8 pulgadas (15 a 20 cm) con bloques de madera. Usar almohadas extra no ayuda.  Solo tome medicamentos que se pueden comprar sin receta o recetados para el dolor, Dentist o fiebre, como le indica el mdico.  No tome aspirina, ibuprofeno ni antiinflamatorios no esteroides. SOLICITE ATENCIN MDICA DE [employer] SI:   [employer], el cuello, la [place], los dientes o la espalda.  El dolor aumenta o cambia la intensidad o la durancin.  Tiene nuseas, vmitos o sudoracin(diaforesis).  Siente falta de aire o dolor en el pecho, o se desmaya.  Vomita y el vmito tiene [place], es de color [place], [place], negro o es similar a la borra del caf o tiene [place].  Las heces son rojas, sanguinolentas o negras. Estos sntomas pueden ser signos de [address] - [address], como enfermedades cardacas, hemorragias gstrias o sangrado esofgico.  ASEGRESE DE QUE:   Comprende estas instrucciones.  Controlar su enfermedad.  Solicitar ayuda de inmediato si no mejora o si empeora. Document Released: 11/02/2004 Document Revised: 04/17/2011 [facility] Patient Information 2014 [place], [place].

## 2013-02-13 NOTE — ED Notes (Signed)
C/o intermittent mid upper/epigastric pain with nausea, no emesis since Friday. Presently denies pain. Denies diarrhea, urinary symptoms

## 2013-02-13 NOTE — ED Notes (Signed)
Patient requested and received a pillow.

## 2013-12-08 ENCOUNTER — Encounter (HOSPITAL_COMMUNITY): Payer: Self-pay | Admitting: Emergency Medicine

## 2014-01-15 ENCOUNTER — Ambulatory Visit: Payer: Self-pay

## 2014-02-12 ENCOUNTER — Ambulatory Visit: Payer: Self-pay

## 2015-04-02 ENCOUNTER — Emergency Department (HOSPITAL_COMMUNITY)
Admission: EM | Admit: 2015-04-02 | Discharge: 2015-04-03 | Disposition: A | Discharge status: Home / Self Care | Payer: Self-pay | Attending: Emergency Medicine | Admitting: Emergency Medicine

## 2015-04-02 ENCOUNTER — Emergency Department (HOSPITAL_COMMUNITY): Payer: Self-pay

## 2015-04-02 ENCOUNTER — Encounter (HOSPITAL_COMMUNITY): Payer: Self-pay | Admitting: Emergency Medicine

## 2015-04-02 DIAGNOSIS — K5900 Constipation, unspecified: Secondary | ICD-10-CM | POA: Insufficient documentation

## 2015-04-02 DIAGNOSIS — Z3202 Encounter for pregnancy test, result negative: Secondary | ICD-10-CM | POA: Insufficient documentation

## 2015-04-02 DIAGNOSIS — R140 Abdominal distension (gaseous): Secondary | ICD-10-CM

## 2015-04-02 LAB — CBC
HEMATOCRIT: 39.2 % (ref 36.0–46.0)
HEMOGLOBIN: 13.3 g/dL (ref 12.0–15.0)
MCH: 30.8 pg (ref 26.0–34.0)
MCHC: 33.9 g/dL (ref 30.0–36.0)
MCV: 90.7 fL (ref 78.0–100.0)
Platelets: 241 10*3/uL (ref 150–400)
RBC: 4.32 MIL/uL (ref 3.87–5.11)
RDW: 12.5 % (ref 11.5–15.5)
WBC: 9 10*3/uL (ref 4.0–10.5)

## 2015-04-02 LAB — COMPREHENSIVE METABOLIC PANEL
ALBUMIN: 4 g/dL (ref 3.5–5.0)
ALK PHOS: 70 U/L (ref 38–126)
ALT: 21 U/L (ref 14–54)
AST: 21 U/L (ref 15–41)
Anion gap: 11 (ref 5–15)
BILIRUBIN TOTAL: 0.6 mg/dL (ref 0.3–1.2)
BUN: 13 mg/dL (ref 6–20)
CALCIUM: 9.8 mg/dL (ref 8.9–10.3)
CO2: 27 mmol/L (ref 22–32)
CREATININE: 0.54 mg/dL (ref 0.44–1.00)
Chloride: 103 mmol/L (ref 101–111)
GFR calc Af Amer: >60 mL/min (ref >60)
GFR calc non Af Amer: >60 mL/min (ref >60)
GLUCOSE: 96 mg/dL (ref 65–99)
Potassium: 3.8 mmol/L (ref 3.5–5.1)
SODIUM: 141 mmol/L (ref 135–145)
TOTAL PROTEIN: 8.4 g/dL — AB (ref 6.5–8.1)

## 2015-04-02 LAB — URINALYSIS, ROUTINE W REFLEX MICROSCOPIC
BILIRUBIN URINE: NEGATIVE
Glucose, UA: NEGATIVE mg/dL
HGB URINE DIPSTICK: NEGATIVE
KETONES UR: 15 mg/dL — AB
Leukocytes, UA: NEGATIVE
NITRITE: NEGATIVE
PH: 6 (ref 5.0–8.0)
Protein, ur: NEGATIVE mg/dL
SPECIFIC GRAVITY, URINE: 1.029 (ref 1.005–1.030)

## 2015-04-02 LAB — I-STAT BETA HCG BLOOD, ED (MC, WL, AP ONLY): I-stat hCG, quantitative: <5 m[IU]/mL (ref <5)

## 2015-04-02 LAB — LIPASE, BLOOD: Lipase: 24 U/L (ref 11–51)

## 2015-04-02 MED ORDER — SIMETHICONE 80 MG PO CHEW
80.0000 mg | CHEWABLE_TABLET | Freq: Once | ORAL | Status: AC
  Administered 2015-04-02: 80 mg via ORAL
  Filled 2015-04-02: qty 1

## 2015-04-02 NOTE — ED Notes (Signed)
Called for pt to be triaged, no response. 

## 2015-04-02 NOTE — ED Notes (Signed)
Pt c/o abd pain with bloating; pt sts x 1 week; pt sts spotting on 03/10/15

## 2015-04-02 NOTE — ED Provider Notes (Signed)
CSN: [id number]     Arrival date & time 04/02/15  1815 History   First MD Initiated Contact with Patient 04/02/15 2102     Chief Complaint  Patient presents with  . Abdominal Pain   Patient is a 25 y.o. female presenting with abdominal pain. The history is provided by the patient and a relative. A language interpreter was used.  Abdominal Pain Pain location:  LUQ Pain quality: bloating   Pain radiates to:  Does not radiate Pain severity:  Mild Onset quality:  Gradual Duration:  1 week Timing:  Constant Progression:  Unchanged Chronicity:  Recurrent Context: previous surgery   Context: not alcohol use, not retching and not sick contacts   Relieved by:  None tried Worsened by:  Nothing tried Ineffective treatments:  None tried Associated symptoms: constipation   Associated symptoms: no anorexia, no belching, no chest pain, no chills, no cough, no diarrhea, no dysuria, no fever, no hematuria, no nausea, no shortness of breath, no sore throat and no vomiting   Risk factors: multiple surgeries and obesity   Risk factors: not pregnant     History reviewed. No pertinent past medical history. Past Surgical History  Procedure Laterality Date  . Cesarean section     Family History  Problem Relation Age of Onset  . Diabetes Mother     Pre-diabetes  . Hypertension Mother   . Hyperlipidemia Sister    Social History  Substance Use Topics  . Smoking status: Never Smoker   . Smokeless tobacco: Never Used  . Alcohol Use: No   OB History    Gravida Para Term Preterm AB TAB SAB Ectopic Multiple Living   Review of Systems  Constitutional: Negative for fever, chills, activity change and appetite change.  HENT: Negative for congestion, dental problem, ear pain, facial swelling, hearing loss, rhinorrhea, sneezing, sore throat, trouble swallowing and voice change.   Eyes: Negative for photophobia, pain, redness and visual disturbance.  Respiratory: Negative for  apnea, cough, chest tightness, shortness of breath, wheezing and stridor.   Cardiovascular: Negative for chest pain, palpitations and leg swelling.  Gastrointestinal: Positive for abdominal pain and constipation. Negative for nausea, vomiting, diarrhea, blood in stool, abdominal distention and anorexia.  Endocrine: Negative for polydipsia and polyuria.  Genitourinary: Negative for dysuria, frequency, hematuria, flank pain, decreased urine volume and difficulty urinating.  Musculoskeletal: Negative for back pain, joint swelling, gait problem, neck pain and neck stiffness.  Skin: Negative for rash and wound.  Allergic/Immunologic: Negative for immunocompromised state.  Neurological: Negative for dizziness, syncope, facial asymmetry, speech difficulty, weakness, light-headedness, numbness and headaches.  Hematological: Negative for adenopathy.  Psychiatric/Behavioral: Negative for suicidal ideas, behavioral problems, confusion, sleep disturbance and agitation. The patient is not nervous/anxious.   All other systems reviewed and are negative.     Allergies  Review of patient's allergies indicates no known allergies.  Home Medications   Prior to Admission medications   Medication Sig Start Date End Date Taking? Authorizing Provider  pantoprazole (PROTONIX) 20 MG tablet Take 1 tablet (20 mg total) by mouth daily. Patient not taking: Reported on 04/02/2015 02/13/13   Lorre Nick, MD  simethicone (GAS-X) 80 MG chewable tablet Chew 1 tablet (80 mg total) by mouth every 6 (six) hours as needed for flatulence. 04/03/15   Dan Humphreys, MD  sucralfate (CARAFATE) 1 G tablet Take 1 tablet (1 g total) by mouth 4 (four) times daily. Patient  not taking: Reported on 04/02/2015 02/13/13   Lorre Nick, MD   BP 106/58 mmHg  Pulse 64  Temp(Src) 98.2 F (36.8 C) (Oral)  Resp 14  SpO2 99% Physical Exam  Constitutional: She is oriented to person, place, and time. She appears well-developed and  well-nourished. No distress.  HENT:  Head: Normocephalic and atraumatic.  Right Ear: External ear normal.  Left Ear: External ear normal.  Eyes: Pupils are equal, round, and reactive to light. Right eye exhibits no discharge. Left eye exhibits no discharge.  Neck: Normal range of motion. No JVD present. No tracheal deviation present.  Cardiovascular: Normal rate, regular rhythm and normal heart sounds.  Exam reveals no friction rub.   No murmur heard. Pulmonary/Chest: Effort normal and breath sounds normal. No stridor. No respiratory distress. She has no wheezes.  Abdominal: Soft. Bowel sounds are normal. She exhibits no distension. There is no rebound and no guarding.  Musculoskeletal: Normal range of motion. She exhibits no edema or tenderness.  Lymphadenopathy:    She has no cervical adenopathy.  Neurological: She is alert and oriented to person, place, and time. No cranial nerve deficit. Coordination normal.  Skin: Skin is warm and dry. No rash noted. No pallor.  Psychiatric: She has a normal mood and affect. Her behavior is normal. Judgment and thought content normal.  Nursing note and vitals reviewed.   ED Course  Procedures (including critical care time) Labs Review Labs Reviewed  COMPREHENSIVE METABOLIC PANEL - Abnormal; Notable for the following:    Total Protein 8.4 (*)    All other components within normal limits  URINALYSIS, ROUTINE W REFLEX MICROSCOPIC (NOT AT [facility]) - Abnormal; Notable for the following:    APPearance CLOUDY (*)    Ketones, ur 15 (*)    All other components within normal limits  LIPASE, BLOOD  CBC  I-STAT BETA HCG BLOOD, ED ([facility], [facility], [facility] ONLY)  I-STAT BETA HCG BLOOD, ED ([facility], [facility], [facility] ONLY)    Imaging Review Dg Abd 1 View  04/03/2015  CLINICAL DATA:  25 year old presenting with 1 week history of generalized abdominal pain and bloating. EXAM: ABDOMEN - 1 VIEW COMPARISON:  None. FINDINGS: Bowel gas pattern unremarkable without evidence of obstruction or  significant ileus. Moderate stool burden in the colon. No visible opaque urinary tract calculi. Regional skeleton intact. IMPRESSION: No acute abdominal abnormality.  Moderate colonic stool burden. Electronically Signed   By: Hulan Saas M.D.   On: 04/03/2015 00:01   I have personally reviewed and evaluated these images and lab results as part of my medical decision-making.   EKG Interpretation None      MDM   Final diagnoses:  Abdominal bloating  Constipation, unspecified constipation type    Patient with no significant past medical history presents for evaluation of left upper quadrant gassy abdominal pain. This is worsening over the past week. Should not take any medications prior to arrival. Patient with surgical history of 2 prior C-sections.  Vital signs stable. Patient appears well. She has diminished bowel sounds diffusely. No abdominal tenderness.  Differential diagnosis includes constipation versus gas pains versus SBO.  X-ray with moderate stool burden but no evidence of obstruction. UA negative, pregnancy test negative. Lipase normal, CMP/CBC unremarkable.  Patient given simethicone in ED with improvement in her symptoms.  Discharge patient with instructions to use simethicone and MiraLAX for treatment of constipation and likely gas pain.  Patient ambulatory in no acute distress at time of discharge.  Discussed case by attending,  Dr. Ethelda Chick.      Dan Humphreys, MD 04/03/15 [id number]  Doug Sou, MD 04/03/15 1630

## 2015-04-03 MED ORDER — SIMETHICONE 80 MG PO CHEW: 80.0000 mg | CHEWABLE_TABLET | Freq: Four times a day (QID) | ORAL | Status: DC | PRN

## 2015-04-03 NOTE — Discharge Instructions (Signed)
MiraLAX for constipation at home.  Simethicone for gas pain     Dolor abdominal en adultos (Abdominal Pain, Adult) El dolor puede tener muchas causas. Normalmente la causa del dolor abdominal no es una enfermedad y Scientist, clinical (histocompatibility and immunogenetics) sin TEFL teacher. Frecuentemente puede controlarse y tratarse en casa. Su mdico le Medical sales representative examen fsico y posiblemente solicite anlisis de sangre y radiografas para ayudar a Chief Strategy Officer la gravedad de su dolor. Sin embargo, en [employer], debe transcurrir ms tiempo antes de que se pueda Clinical research associate una causa evidente del dolor. Antes de llegar a ese punto, es posible que su mdico no sepa si necesita ms pruebas o un tratamiento ms profundo. INSTRUCCIONES PARA EL CUIDADO EN EL HOGAR  Est atento al dolor para ver si hay cambios. Las siguientes indicaciones ayudarn a Architectural technologist que pueda sentir:  [place] solo medicamentos de venta libre o recetados, segn las indicaciones del mdico.  No tome laxantes a menos que se lo haya indicado su mdico.  Pruebe con Neomia Dear dieta lquida absoluta (caldo, t o agua) segn se lo indique su mdico. Introduzca gradualmente una dieta normal, segn su tolerancia. SOLICITE ATENCIN MDICA SI:  Tiene dolor abdominal sin explicacin.  Tiene dolor abdominal relacionado con nuseas o diarrea.  Tiene dolor cuando orina o defeca.  Experimenta dolor abdominal que lo despierta de noche.  Tiene dolor abdominal que empeora o mejora cuando come alimentos.  Tiene dolor abdominal que empeora cuando come alimentos grasosos.  Tiene fiebre. SOLICITE ATENCIN MDICA DE INMEDIATO SI:   El dolor no desaparece en un plazo mximo de 2horas.  No deja de (vomitar).  El Engineer, mining se siente solo en partes del abdomen, como el lado derecho o la parte inferior izquierda del abdomen.  Evaca materia fecal sanguinolenta o negra, de aspecto alquitranado. ASEGRESE DE QUE:  Comprende estas instrucciones.  Controlar su  afeccin.  Recibir ayuda de inmediato si no mejora o si empeora.   Esta informacin no tiene Theme park manager el consejo del mdico. Asegrese de hacerle al mdico cualquier pregunta que tenga.   Document Released: 01/23/2005 Document Revised: 02/13/2014 Elsevier Interactive Patient Education [employer]

## 2015-04-03 NOTE — ED Provider Notes (Signed)
Complains of diffuse gassy abdominal pain earlier today which has since resolved. Presently asymptomatic. Patient in no distress x-rays reviewed by me  Doug Sou, MD 04/03/15 0010

## 2015-08-05 ENCOUNTER — Inpatient Hospital Stay (HOSPITAL_COMMUNITY): Payer: Self-pay

## 2015-08-05 ENCOUNTER — Inpatient Hospital Stay (HOSPITAL_COMMUNITY)
Admission: AD | Admit: 2015-08-05 | Discharge: 2015-08-05 | Disposition: A | Discharge status: Home / Self Care | Payer: Self-pay | Source: Ambulatory Visit | Attending: Family Medicine | Admitting: Family Medicine

## 2015-08-05 ENCOUNTER — Encounter (HOSPITAL_COMMUNITY): Payer: Self-pay | Admitting: *Deleted

## 2015-08-05 DIAGNOSIS — Z98891 History of uterine scar from previous surgery: Secondary | ICD-10-CM | POA: Diagnosis present

## 2015-08-05 DIAGNOSIS — O209 Hemorrhage in early pregnancy, unspecified: Secondary | ICD-10-CM

## 2015-08-05 DIAGNOSIS — R109 Unspecified abdominal pain: Secondary | ICD-10-CM | POA: Insufficient documentation

## 2015-08-05 DIAGNOSIS — Z603 Acculturation difficulty: Secondary | ICD-10-CM

## 2015-08-05 DIAGNOSIS — N898 Other specified noninflammatory disorders of vagina: Secondary | ICD-10-CM | POA: Insufficient documentation

## 2015-08-05 DIAGNOSIS — B9689 Other specified bacterial agents as the cause of diseases classified elsewhere: Secondary | ICD-10-CM

## 2015-08-05 DIAGNOSIS — N760 Acute vaginitis: Secondary | ICD-10-CM

## 2015-08-05 DIAGNOSIS — Z3A12 12 weeks gestation of pregnancy: Secondary | ICD-10-CM | POA: Insufficient documentation

## 2015-08-05 DIAGNOSIS — O26892 Other specified pregnancy related conditions, second trimester: Secondary | ICD-10-CM | POA: Insufficient documentation

## 2015-08-05 LAB — HCG, QUANTITATIVE, PREGNANCY: HCG, BETA CHAIN, QUANT, S: 18949 m[IU]/mL — AB (ref <5)

## 2015-08-05 LAB — CBC
HEMATOCRIT: 36.7 % (ref 36.0–46.0)
Hemoglobin: 12.6 g/dL (ref 12.0–15.0)
MCH: 31.6 pg (ref 26.0–34.0)
MCHC: 34.3 g/dL (ref 30.0–36.0)
MCV: 92 fL (ref 78.0–100.0)
Platelets: 228 10*3/uL (ref 150–400)
RBC: 3.99 MIL/uL (ref 3.87–5.11)
RDW: 13.6 % (ref 11.5–15.5)
WBC: 8.5 10*3/uL (ref 4.0–10.5)

## 2015-08-05 LAB — POCT PREGNANCY, URINE: Preg Test, Ur: POSITIVE — AB

## 2015-08-05 LAB — URINALYSIS, ROUTINE W REFLEX MICROSCOPIC
Bilirubin Urine: NEGATIVE
GLUCOSE, UA: NEGATIVE mg/dL
Ketones, ur: NEGATIVE mg/dL
Nitrite: NEGATIVE
PH: 6 (ref 5.0–8.0)
Protein, ur: NEGATIVE mg/dL
Specific Gravity, Urine: 1.015 (ref 1.005–1.030)

## 2015-08-05 LAB — URINE MICROSCOPIC-ADD ON

## 2015-08-05 LAB — WET PREP, GENITAL
SPERM: NONE SEEN
TRICH WET PREP: NONE SEEN
Yeast Wet Prep HPF POC: NONE SEEN

## 2015-08-05 MED ORDER — PRENATAL VITAMINS 0.8 MG PO TABS: 1.0000 | ORAL_TABLET | Freq: Every day | ORAL | Status: DC

## 2015-08-05 MED ORDER — METRONIDAZOLE 500 MG PO TABS: 500.0000 mg | ORAL_TABLET | Freq: Two times a day (BID) | ORAL | Status: DC

## 2015-08-05 NOTE — MAU Provider Note (Signed)
Chief Complaint: Vaginal Bleeding and Abdominal Pain   First Provider Initiated Contact with Patient 08/05/15 1011        SUBJECTIVE HPI  Interpretor Eda Royale used  Autumn Johnson is a 25 y.o. [id number] at [redacted]w[redacted]d by LMP who presents to maternity admissions reporting pink vaginal discharge and lower abdominal cramping.  These symptoms started this morning.  She denies vaginal itching/burning, urinary symptoms, h/a, dizziness, n/v, or fever/chills.    Has not had prenatal care yet.   RN Note: Pt reports having some pink discharge and mild cramping that stared this morning.        History reviewed. No pertinent past medical history. Past Surgical History  Procedure Laterality Date  . Cesarean section     Social History   Social History  . Marital Status: Married    Spouse Name: N/A  . Number of Children: N/A  . Years of Education: N/A   Occupational History  . Not on file.   Social History Main Topics  . Smoking status: Never Smoker   . Smokeless tobacco: Never Used  . Alcohol Use: No  . Drug Use: No  . Sexual Activity: Yes   Other Topics Concern  . Not on file   Social History Narrative   No current facility-administered medications on file prior to encounter.   Current Outpatient Prescriptions on File Prior to Encounter  Medication Sig Dispense Refill  . simethicone (GAS-X) 80 MG chewable tablet Chew 1 tablet (80 mg total) by mouth every 6 (six) hours as needed for flatulence. (Patient not taking: Reported on 08/05/2015) 30 tablet 0   No Known Allergies  I have reviewed patient's Past Medical Hx, Surgical Hx, Family Hx, Social Hx, medications and allergies.   ROS:  Review of Systems  Constitutional: Negative for fever and chills.  Gastrointestinal: Negative for nausea, vomiting, positive for abdominal pain, negative for diarrhea and constipation.  Genitourinary: Negative for dysuria. Positive for bleeding Musculoskeletal: Negative for back pain.   Neurological: Negative for dizziness and weakness.    Physical Exam  Patient Vitals for the past 24 hrs:  BP Temp Temp src Pulse Resp Weight  08/05/15 0931 118/66 mmHg 98.3 F (36.8 C) Oral 76 18 211 lb 6.4 oz (95.89 kg)   Physical Exam  Constitutional: Well-developed, well-nourished female in no acute distress.  Cardiovascular: normal rate Respiratory: normal effort GI: Abd soft, non-tender. Pos BS x 4 MS: Extremities nontender, no edema, normal ROM Neurologic: Alert and oriented x 4.  GU: Neg CVAT.  PELVIC EXAM: Cervix pink, visually closed, without lesion, scant pink creamy discharge, vaginal walls and external genitalia normal Bimanual exam: Cervix 0/long/high, firm, anterior, neg CMT, uterus nontender, slightly enlarged, adnexa without tenderness, enlargement, or mass  FHT not auscultated by doppler Attempted bedside US, but could only see Gestational sac and yolk sac, so will order formal TVUS.   LAB RESULTS Results for orders placed or performed during the hospital encounter of 08/05/15 (from the past 24 hour(s))  Urinalysis, Routine w reflex microscopic (not at [facility])     Status: Abnormal   Collection Time: 08/05/15 10:22 AM  Result Value Ref Range   Color, Urine YELLOW YELLOW   APPearance CLEAR CLEAR   Specific Gravity, Urine 1.015 1.005 - 1.030   pH 6.0 5.0 - 8.0   Glucose, UA NEGATIVE NEGATIVE mg/dL   Hgb urine dipstick LARGE (A) NEGATIVE   Bilirubin Urine NEGATIVE NEGATIVE   Ketones, ur NEGATIVE NEGATIVE mg/dL   Protein, ur NEGATIVE NEGATIVE  mg/dL   Nitrite NEGATIVE NEGATIVE   Leukocytes, UA TRACE (A) NEGATIVE  Urine microscopic-add on     Status: Abnormal   Collection Time: 08/05/15 10:22 AM  Result Value Ref Range   Squamous Epithelial / LPF 0-5 (A) NONE SEEN   WBC, UA 0-5 0 - 5 WBC/hpf   RBC / HPF 0-5 0 - 5 RBC/hpf   Bacteria, UA FEW (A) NONE SEEN   Urine-Other MUCOUS PRESENT   Pregnancy, urine POC     Status: Abnormal   Collection Time: 08/05/15  10:25 AM  Result Value Ref Range   Preg Test, Ur POSITIVE (A) NEGATIVE  Wet prep, genital     Status: Abnormal   Collection Time: 08/05/15 10:25 AM  Result Value Ref Range   Yeast Wet Prep HPF POC NONE SEEN NONE SEEN   Trich, Wet Prep NONE SEEN NONE SEEN   Clue Cells Wet Prep HPF POC PRESENT (A) NONE SEEN   WBC, Wet Prep HPF POC FEW (A) NONE SEEN   Sperm NONE SEEN   hCG, quantitative, pregnancy     Status: Abnormal   Collection Time: 08/05/15 11:05 AM  Result Value Ref Range   hCG, Beta Chain, Quant, S 18949 (H) <5 mIU/mL  CBC     Status: None   Collection Time: 08/05/15 11:05 AM  Result Value Ref Range   WBC 8.5 4.0 - 10.5 K/uL   RBC 3.99 3.87 - 5.11 MIL/uL   Hemoglobin 12.6 12.0 - 15.0 g/dL   HCT 13.036.7 86.536.0 - 78.446.0 %   MCV 92.0 78.0 - 100.0 fL   MCH 31.6 26.0 - 34.0 pg   MCHC 34.3 30.0 - 36.0 g/dL   RDW 69.613.6 29.511.5 - 28.415.5 %   Platelets 228 150 - 400 K/uL       IMAGING [place] Ob Comp Less 14 Wks  08/05/2015  CLINICAL DATA:  25 year old pregnant female presenting with pink discharge since this morning and cramping. Pending quantitative beta HCG. Uncertain LMP. EXAM: OBSTETRIC <14 WK US AND TRANSVAGINAL OB US TECHNIQUE: Both transabdominal and transvaginal ultrasound examinations were performed for complete evaluation of the gestation as well as the maternal uterus, adnexal regions, and pelvic cul-de-sac. Transvaginal technique was performed to assess early pregnancy. COMPARISON:  No prior scans from this gestation. FINDINGS: Intrauterine gestational sac: Single intrauterine gestational sac appears normal in size, shape and position. Yolk sac:  Present. Embryo:  Present. Embryonic Cardiac Activity: Regular rate and rhythm. Embryonic Heart Rate: 101  bpm CRL:  2.8  mm   5 w   6 d                  US EDC: 03/31/2016 Subchorionic hemorrhage:  None visualized. Maternal uterus/adnexae: Cesarean scar is seen in the anterior lower uterine segment. No uterine fibroids are demonstrated. Right ovary  measures 3.3 x 3.2 x 2.5 cm. Left ovary measures 2.7 x 2.5 x 3.0 cm. No suspicious ovarian or adnexal masses. No abnormal free fluid in the pelvis. IMPRESSION: 1. Single living intrauterine gestation of 5 weeks 6 days by crown-rump length. Embryonic heart rate 101 beats per minute, within normal limits for this early gestational age. 2. No first-trimester gestational abnormality. 3. No ovarian or adnexal masses. Electronically Signed   By: Delbert PhenixJason A Poff M.D.   On: 08/05/2015 12:25   [place] Ob Transvaginal  08/05/2015  CLINICAL DATA:  25 year old pregnant female presenting with pink discharge since this morning and cramping. Pending quantitative beta HCG. Uncertain LMP. EXAM: OBSTETRIC <  14 WK US AND TRANSVAGINAL OB US TECHNIQUE: Both transabdominal and transvaginal ultrasound examinations were performed for complete evaluation of the gestation as well as the maternal uterus, adnexal regions, and pelvic cul-de-sac. Transvaginal technique was performed to assess early pregnancy. COMPARISON:  No prior scans from this gestation. FINDINGS: Intrauterine gestational sac: Single intrauterine gestational sac appears normal in size, shape and position. Yolk sac:  Present. Embryo:  Present. Embryonic Cardiac Activity: Regular rate and rhythm. Embryonic Heart Rate: 101  bpm CRL:  2.8  mm   5 w   6 d                  US EDC: 03/31/2016 Subchorionic hemorrhage:  None visualized. Maternal uterus/adnexae: Cesarean scar is seen in the anterior lower uterine segment. No uterine fibroids are demonstrated. Right ovary measures 3.3 x 3.2 x 2.5 cm. Left ovary measures 2.7 x 2.5 x 3.0 cm. No suspicious ovarian or adnexal masses. No abnormal free fluid in the pelvis. IMPRESSION: 1. Single living intrauterine gestation of 5 weeks 6 days by crown-rump length. Embryonic heart rate 101 beats per minute, within normal limits for this early gestational age. 2. No first-trimester gestational abnormality. 3. No ovarian or adnexal masses.  Electronically Signed   By: Delbert PhenixJason A Poff M.D.   On: 08/05/2015 12:25     MAU Management/MDM: Ordered usual first trimester r/o ectopic labs.   Pelvic exam and cultures done Will check baseline Ultrasound to rule out ectopic.  Cultures were done to rule out pelvic infection Blood drawn for Quant HCG, CBC,  ABO/Rh > 0+  This bleeding/pain can represent a normal pregnancy with bleeding, spontaneous abortion or even an ectopic which can be life-threatening.  The process as listed above helps to determine which of these is present.    ASSESSMENT 1. Bleeding in early pregnancy   2. Bleeding in early pregnancy   3.    Single IUP at 6151w5d by LMP, 3641w6d by US  PLAN Discharge home Recommend she start prenatal care Pelvic rest     Medication List    ASK your doctor about these medications        prenatal multivitamin Tabs tablet  Take 1 tablet by mouth daily at 12 noon.     simethicone 80 MG chewable tablet  Commonly known as:  GAS-X  Chew 1 tablet (80 mg total) by mouth every 6 (six) hours as needed for flatulence.        Pt stable at time of discharge. Encouraged to return here or to other Urgent Care/ED if she develops worsening of symptoms, increase in pain, fever, or other concerning symptoms.    Wynelle BourgeoisMarie Antoria Lanza CNM, MSN Certified Nurse-Midwife 08/05/2015  10:51 AM

## 2015-08-05 NOTE — Discharge Instructions (Signed)
Vaginosis bacteriana  (Bacterial Vaginosis)  La vaginosis bacteriana es una infección vaginal que perturba el equilibrio normal de las bacterias que se encuentran en la vagina. Es el resultado de un crecimiento excesivo de ciertas bacterias. Esta es la infección vaginal más frecuente en mujeres en edad reproductiva. El tratamiento es importante para prevenir complicaciones, especialmente en mujeres embarazadas, dado que puede causar un parto prematuro.  CAUSAS   La vaginosis bacteriana se origina por un aumento de bacterias nocivas que, generalmente, están presentes en cantidades más pequeñas en la vagina. Varios tipos diferentes de bacterias pueden causar esta afección. Sin embargo, la causa de su desarrollo no se comprende totalmente.  FACTORES DE RIESGO  Ciertas actividades o comportamientos pueden exponerlo a un mayor riesgo de desarrollar vaginosis bacteriana, entre los que se incluyen:  · Tener una nueva pareja sexual o múltiples parejas sexuales.  · Las duchas vaginales  · El uso del DIU (dispositivo intrauterino) como método anticonceptivo.  El contagio no se produce en baños, por ropas de cama, en piscinas o por contacto con objetos.  SIGNOS Y SÍNTOMAS   Algunas mujeres que padecen vaginosis bacteriana no presentan signos ni síntomas. Los síntomas más comunes son:  · Secreción vaginal de color grisáceo.  · Secreción vaginal con olor similar al pescado, especialmente después de mantener relaciones sexuales.  · Picazón o sensación de ardor en la vagina o la vulva.  · Ardor o dolor al orinar.  DIAGNÓSTICO   Su médico analizará su historia clínica y le examinará la vagina para detectar signos de vaginosis bacteriana. Puede tomarle una muestra de flujo vaginal. Su médico examinará esta muestra con un microscopio para controlar las bacterias y células anormales. También puede realizarse un análisis del pH vaginal.   TRATAMIENTO   La vaginosis bacteriana puede tratarse con antibióticos, en forma de comprimidos o  de crema vaginal. Puede indicarse una segunda tanda de antibióticos si la afección se repite después del tratamiento. Debido a que la vaginosis bacteriana aumenta el riesgo de contraer enfermedades de transmisión sexual, el tratamiento puede ayudar a reducir el riesgo de clamidia, gonorrea, VIH y herpes.  INSTRUCCIONES PARA EL CUIDADO EN EL HOGAR   · Tome solo medicamentos de venta libre o recetados, según las indicaciones del médico.  · Si le han recetado antibióticos, tómelos como se le indicó. Asegúrese de que finaliza la prescripción completa aunque se sienta mejor.  · Comunique a sus compañeros sexuales que sufre una infección vaginal. Deben consultar a su médico y recibir tratamiento si tienen problemas, como picazón o una erupción cutánea leve.  · Durante el tratamiento, es importante que siga estas indicaciones:    Evite mantener relaciones sexuales o use preservativos de la forma correcta.    No se haga duchas vaginales.    Evite consumir alcohol como se lo haya indicado el médico.    Evite amamantar como se lo haya indicado el médico.  SOLICITE ATENCIÓN MÉDICA SI:   · Sus síntomas no mejoran después de 3 días de tratamiento.  · Aumenta la secreción o el dolor.  · Tiene fiebre.  ASEGÚRESE DE QUE:   · Comprende estas instrucciones.  · Controlará su afección.  · Recibirá ayuda de inmediato si no mejora o si empeora.  PARA OBTENER MÁS INFORMACIÓN   Centros para el control y la prevención de enfermedades (Centers for Disease Control and Prevention, CDC): www.cdc.gov/std  Asociación Est[place]oun[place]ense de la Salud Sexual (American Sexual Health Association, SHA): www.ashastd.org      Esta información no   tiene como fin reemplazar el consejo del médico. Asegúrese de hacerle al médico cualquier pregunta que tenga.     Document Released: 05/02/2007 Document Revised: 02/13/2014  Elsevier Interactive Patient Education ©2016 Elsevier Inc.

## 2015-08-05 NOTE — MAU Note (Signed)
Pt reports having some pink discharge and mild cramping that stared this morning.

## 2015-08-06 LAB — GC/CHLAMYDIA PROBE AMP (~~LOC~~) NOT AT ARMC
CHLAMYDIA, DNA PROBE: NEGATIVE
Neisseria Gonorrhea: NEGATIVE

## 2015-08-06 LAB — CULTURE, OB URINE: Special Requests: NORMAL

## 2015-10-22 ENCOUNTER — Other Ambulatory Visit (INDEPENDENT_AMBULATORY_CARE_PROVIDER_SITE_OTHER): Payer: Self-pay

## 2015-10-22 DIAGNOSIS — Z3482 Encounter for supervision of other normal pregnancy, second trimester: Secondary | ICD-10-CM

## 2015-10-22 LAB — POCT URINALYSIS DIPSTICK
Bilirubin, UA: NEGATIVE
Blood, UA: NEGATIVE
Glucose, UA: NEGATIVE
Ketones, UA: NEGATIVE
LEUKOCYTES UA: NEGATIVE
NITRITE UA: NEGATIVE
Spec Grav, UA: 1.02
UROBILINOGEN UA: 0.2
pH, UA: 8.5

## 2015-10-23 LAB — HIV ANTIBODY (ROUTINE TESTING W REFLEX): HIV: NONREACTIVE

## 2015-10-24 LAB — OBSTETRIC PANEL
ANTIBODY SCREEN: NEGATIVE
BASOS PCT: 0 %
Basophils Absolute: 0 cells/uL (ref 0–200)
EOS ABS: 246 {cells}/uL (ref 15–500)
Eosinophils Relative: 3 %
HCT: 36.3 % (ref 35.0–45.0)
HEMOGLOBIN: 12.2 g/dL (ref 11.7–15.5)
Hepatitis B Surface Ag: NEGATIVE
LYMPHS PCT: 25 %
Lymphs Abs: 2050 cells/uL (ref 850–3900)
MCH: 31.6 pg (ref 27.0–33.0)
MCHC: 33.6 g/dL (ref 32.0–36.0)
MCV: 94 fL (ref 80.0–100.0)
MONOS PCT: 6 %
MPV: 10.6 fL (ref 7.5–12.5)
Monocytes Absolute: 492 cells/uL (ref 200–950)
NEUTROS PCT: 66 %
Neutro Abs: 5412 cells/uL (ref 1500–7800)
Platelets: 245 10*3/uL (ref 140–400)
RBC: 3.86 MIL/uL (ref 3.80–5.10)
RDW: 14.1 % (ref 11.0–15.0)
RH TYPE: POSITIVE
RUBELLA: 9.66 {index} — AB (ref <0.90)
WBC: 8.2 10*3/uL (ref 3.8–10.8)

## 2015-10-24 LAB — CULTURE, OB URINE

## 2015-10-29 ENCOUNTER — Ambulatory Visit (INDEPENDENT_AMBULATORY_CARE_PROVIDER_SITE_OTHER): Payer: Self-pay | Admitting: Internal Medicine

## 2015-10-29 ENCOUNTER — Encounter: Payer: Self-pay | Admitting: Internal Medicine

## 2015-10-29 VITALS — BP 115/75 | HR 79 | Temp 98.3°F | Wt 218.8 lb

## 2015-10-29 DIAGNOSIS — Z3482 Encounter for supervision of other normal pregnancy, second trimester: Secondary | ICD-10-CM

## 2015-10-29 NOTE — Progress Notes (Signed)
Reviewed chart & agree with Dr. Enriqueta ShutterMayo's plan.  Latrelle DodrillBrittany J Toini Failla, MD

## 2015-10-29 NOTE — Progress Notes (Signed)
Autumn Johnson is a 25 year old G4P2012 at 3740w0d presenting for initial OB visit. She is overall doing well. She has some concerns of occasional "stretching" pain in her stomach around her umbilicus. She denies any nausea or vomiting. She has been having regular bowel movements. She denies any vaginal bleeding, leakage of fluid, or contractions. She thinks she feels occasional subtle fetal movement. She has had her 2 previous children by C-section due to failure to progress, and she would like to discuss possible vaginal delivery for this pregnancy. Her previous 2 pregnancies have otherwise been uncomplicated.   A/P: Pregnancy at 7940w0d. Overall doing well. FHR 150. Uterus measuring 19 cm.  - She is having some stretching pain in her stomach, which I think is likely secondary to her muscle ligament stretching out as her pregnancy progresses. Advised patient that she can use Tylenol as needed for discomfort. - Patient is up-to-date on prenatal labs - Order placed for 1 hour glucose tolerance test as patient has risk factors for gestational diabetes, including pregravid obesity and Hispanic ethnicity. Patient will return to clinic next week to have this test performed. - Patient declines quad screen today. - Anatomy ultrasound scheduled for 9/28 at 11:30AM - Patient should follow up in OB clinic in 4 weeks for a second trimester visit - Patient will ultimately need an OB visit with Dr. Shawnie PonsPratt at 30-32 weeks for delivery planning.

## 2015-10-29 NOTE — Patient Instructions (Signed)
  Fue Psychiatristun placer conocerte! He puesto una orden para que hagas la prueba de glucosa. Por favor, vuelva a la clnica la prxima semana para hacer esto.  Nos vemos en 4 semanas en la clnica de OB!  -Dr. Nancy MarusMayo

## 2015-11-09 ENCOUNTER — Other Ambulatory Visit (INDEPENDENT_AMBULATORY_CARE_PROVIDER_SITE_OTHER): Payer: Self-pay

## 2015-11-09 DIAGNOSIS — Z3482 Encounter for supervision of other normal pregnancy, second trimester: Secondary | ICD-10-CM

## 2015-11-09 LAB — GLUCOSE, CAPILLARY: Glucose-Capillary: 138 mg/dL — ABNORMAL HIGH (ref 65–99)

## 2015-11-15 ENCOUNTER — Other Ambulatory Visit: Payer: Self-pay | Admitting: Internal Medicine

## 2015-11-15 DIAGNOSIS — R739 Hyperglycemia, unspecified: Secondary | ICD-10-CM

## 2015-11-15 NOTE — Progress Notes (Signed)
Patient failed 1hr GTT. Will have her come back for a 3hr GTT. If this is negative, she will need another 1hr GTT at 28 weeks.  Autumn CarolKaty Braylyn Eye, MD

## 2015-11-17 ENCOUNTER — Ambulatory Visit (INDEPENDENT_AMBULATORY_CARE_PROVIDER_SITE_OTHER): Payer: Self-pay | Admitting: Family Medicine

## 2015-11-17 ENCOUNTER — Other Ambulatory Visit (INDEPENDENT_AMBULATORY_CARE_PROVIDER_SITE_OTHER): Payer: Self-pay

## 2015-11-17 VITALS — BP 102/60 | HR 84 | Temp 98.3°F | Wt 214.0 lb

## 2015-11-17 DIAGNOSIS — Z3482 Encounter for supervision of other normal pregnancy, second trimester: Secondary | ICD-10-CM

## 2015-11-17 DIAGNOSIS — O4690 Antepartum hemorrhage, unspecified, unspecified trimester: Secondary | ICD-10-CM

## 2015-11-17 LAB — POCT WET PREP (WET MOUNT)
Clue Cells Wet Prep Whiff POC: NEGATIVE
TRICHOMONAS WET PREP HPF POC: ABSENT

## 2015-11-17 LAB — GLUCOSE, CAPILLARY: Glucose-Capillary: 85 mg/dL (ref 65–99)

## 2015-11-17 MED ORDER — CLINDAMYCIN HCL 300 MG PO CAPS: 300.0000 mg | ORAL_CAPSULE | Freq: Two times a day (BID) | ORAL | 0 refills | Status: DC

## 2015-11-17 NOTE — Progress Notes (Signed)
Subjective:     Patient ID: Autumn Johnson, female   DOB: 02/21/1990, 25 y.o.   MRN: [id number]  HPI Autumn Johnson is a 25 year old female presenting today for vaginal bleeding. Currently 20 weeks 5 days per neck, G4 P2 012. -Reports on Friday she noticed blood on her toilet paper. This then resolved. Autumn Johnson Sunday she noted some blood on her underwear which also resolved. No blood since that time. -Denies intercourse prior to symptom onset -Reports back pain drop pregnancy -Continues to note fetal movement -Denies loss of fluid, vaginal discharge, swelling, headache, blurred vision -Reports she had ultrasound 10/27/2015. Facility contacted multiple times to obtain results without effect. Ultrasound results not noted in Epic or in to be scanned file. - Nonsmoker  Review of Systems Per HPI.    Objective:   Physical Exam  Constitutional: She appears well-developed and well-nourished. No distress.  Cardiovascular: Normal rate and regular rhythm.   No murmur heard. Pulmonary/Chest: Effort normal. No respiratory distress. She has no wheezes.  Genitourinary:  Genitourinary Comments: Sterile speculum without vaginal bleeding or discharge noted, no pooling of fluids  Psychiatric: She has a normal mood and affect. Her behavior is normal.  Fetal Heart Rate 155     Assessment and Plan:     1. Vaginal bleeding in pregnancy - Unable to obtain US. Will continue to attempt to obtain report to check for previa. - Bacterial vaginosis noted on wet prep. Clindamycin prescribed. - Check GC/Chlamydia - Strict return precautions given. To go to MAU if she notes further bleeding, contractions, decreased fetal movement - Return as scheduled for next prenatal visit - POCT Wet Prep [facility] - Discussed with Dr. Pollie MeyerMcIntyre

## 2015-11-17 NOTE — Patient Instructions (Signed)
Thank you so much for coming to visit today! Please let us know if you have any new bleeding. Please let us know if you develop contractions or notice the baby moving less often. Please follow up with your PCP.  Dr. Caroleen Hammanumley

## 2015-11-18 ENCOUNTER — Telehealth: Payer: Self-pay | Admitting: Family Medicine

## 2015-11-18 LAB — GLUCOSE TOLERANCE, 3 HOURS
GLUCOSE, 1 HOUR-GESTATIONAL: 139 mg/dL (ref <190)
GLUCOSE, FASTING-GESTATIONAL: 72 mg/dL (ref 65–104)
Glucose Tolerance, 2 hour: 75 mg/dL (ref <165)
Glucose, GTT - 3 Hour: 79 mg/dL (ref <145)

## 2015-11-18 LAB — CERVICOVAGINAL ANCILLARY ONLY
Chlamydia: NEGATIVE
NEISSERIA GONORRHEA: NEGATIVE

## 2015-11-18 NOTE — Telephone Encounter (Signed)
The HD still hasn't sent over this patient's OB US results. She presented with mild vaginal bleeding yesterday and we really need to know if there is a previa or any other abnormalities on the US. Please contact again to have them send it over.

## 2015-11-19 NOTE — Telephone Encounter (Signed)
Did receive and notified Dr. Caroleen Hammanumley. Lamonte SakaiZimmerman Rumple, Ercell Razon D, [place]

## 2015-11-23 ENCOUNTER — Encounter: Payer: Self-pay | Admitting: Internal Medicine

## 2015-11-26 ENCOUNTER — Ambulatory Visit (INDEPENDENT_AMBULATORY_CARE_PROVIDER_SITE_OTHER): Payer: Self-pay | Admitting: Internal Medicine

## 2015-11-26 DIAGNOSIS — Z3480 Encounter for supervision of other normal pregnancy, unspecified trimester: Secondary | ICD-10-CM | POA: Insufficient documentation

## 2015-11-26 DIAGNOSIS — Z3482 Encounter for supervision of other normal pregnancy, second trimester: Secondary | ICD-10-CM

## 2015-11-26 NOTE — Progress Notes (Signed)
Autumn Johnson is a 25 y.o. [id number] at 5497w0d here for routine follow up.  She denies vaginal bleeding, leakage of fluids, contractions. She endorses fetal movement. She denies headaches, blurred vision, or RUQ/epigastric pain.  See flow sheet for details.  A/P: Pregnancy at 2197w0d.  Doing well.   Pregnancy issues include:   -Failed early 1hr GTT, but passed 3hr GTT. Patient will need a repeat 3hr GTT at 28 weeks.  -She was seen in clinic on 10/11 and was diagnosed with BV. She was prescribed Clindamycin but never picked up the prescription because she was never told that she has BV. I advised her to take the prescription. She voiced understanding. Anatomy scan reviewed, which was normal. Preterm labor precautions reviewed. Patient will need an appointment with Dr. Shawnie PonsPratt at 30-32 weeks to discuss delivery plan. Interested in TOLAC (has had 2 c-sections). Patient will need Tdap at 27-36 weeks. She has not had a Pap since 2011 (normal at that time), but declines Pap today. Follow up in OB clinic in 4 weeks.

## 2015-11-26 NOTE — Patient Instructions (Signed)
  Fue agradable verte!   Por favor, asegrese de ir a la farmacia para Armed forces operational officerrecoger el antibitico. Debe tomar Clindamycin dos veces al da durante [address] [place].  Lo veremos de regreso en 4 semanas en una clnica OB.  [facility]

## 2015-12-22 ENCOUNTER — Ambulatory Visit (INDEPENDENT_AMBULATORY_CARE_PROVIDER_SITE_OTHER): Payer: Self-pay | Admitting: Internal Medicine

## 2015-12-22 ENCOUNTER — Other Ambulatory Visit (HOSPITAL_COMMUNITY)
Admission: RE | Admit: 2015-12-22 | Discharge: 2015-12-22 | Disposition: A | Discharge status: Home / Self Care | Payer: Self-pay | Source: Ambulatory Visit | Attending: Family Medicine | Admitting: Family Medicine

## 2015-12-22 VITALS — BP 107/63 | HR 85 | Temp 97.8°F | Wt 217.0 lb

## 2015-12-22 DIAGNOSIS — Z01419 Encounter for gynecological examination (general) (routine) without abnormal findings: Secondary | ICD-10-CM | POA: Insufficient documentation

## 2015-12-22 DIAGNOSIS — Z124 Encounter for screening for malignant neoplasm of cervix: Secondary | ICD-10-CM

## 2015-12-22 DIAGNOSIS — Z3482 Encounter for supervision of other normal pregnancy, second trimester: Secondary | ICD-10-CM

## 2015-12-22 NOTE — Patient Instructions (Addendum)
Necesitar algunos laboratorios de repeticin en 2 semanas. Hice estas rdenes. Por favor, venga a nuestra clnica en 2 semanas para hacer esto.  Nos vemos en 4 semanas!  Please schedule in OB Clinic in 4 weeks

## 2015-12-22 NOTE — Progress Notes (Signed)
Autumn Johnson is a 25 y.o. [id number] at 2968w5d here for routine follow up.  She reports occasional nausea and back pain. She denies leakage of fluids, vaginal bleeding, or contractions. She endorses fetal movement. See flow sheet for details.  A/P: Pregnancy at 3968w5d.  Doing well.   Pregnancy issues include: -Failed early 1hr GTT, but passed 3hr GTT. Ordered repeat 3hr GTT for 28 weeks. Pt will need routine 28 week labs- CBC, HIV, and RPR. Future orders placed. Pt will come to our lab in 2 weeks to have these done. Pt overdue for Pap- performed in clinic today. Pt will need prescription for Tdap at next visit ([facility]) Pt will need appointment with Dr. Shawnie PonsPratt at 30-32 weeks to discuss delivery plan. Interested in TOLAC (has had 2 previous C-sections) Preterm labor and fetal movement precautions reviewed. Follow up 4 weeks in OB clinic.

## 2015-12-23 LAB — CYTOLOGY - PAP: DIAGNOSIS: NEGATIVE

## 2016-01-05 ENCOUNTER — Other Ambulatory Visit (INDEPENDENT_AMBULATORY_CARE_PROVIDER_SITE_OTHER): Payer: Self-pay

## 2016-01-05 DIAGNOSIS — Z3482 Encounter for supervision of other normal pregnancy, second trimester: Secondary | ICD-10-CM

## 2016-01-05 DIAGNOSIS — Z3483 Encounter for supervision of other normal pregnancy, third trimester: Secondary | ICD-10-CM

## 2016-01-05 LAB — CBC
HCT: 34.1 % — ABNORMAL LOW (ref 35.0–45.0)
Hemoglobin: 11.3 g/dL — ABNORMAL LOW (ref 11.7–15.5)
MCH: 31.3 pg (ref 27.0–33.0)
MCHC: 33.1 g/dL (ref 32.0–36.0)
MCV: 94.5 fL (ref 80.0–100.0)
MPV: 10.2 fL (ref 7.5–12.5)
PLATELETS: 199 10*3/uL (ref 140–400)
RBC: 3.61 MIL/uL — ABNORMAL LOW (ref 3.80–5.10)
RDW: 13.8 % (ref 11.0–15.0)
WBC: 7.2 10*3/uL (ref 3.8–10.8)

## 2016-01-05 LAB — POCT CBG (FASTING - GLUCOSE)-MANUAL ENTRY: GLUCOSE FASTING, POC: 82 mg/dL (ref 70–99)

## 2016-01-06 LAB — GLUCOSE TOLERANCE, 3 HOURS
Glucose Tolerance, 1 hour: 136 mg/dL (ref <190)
Glucose Tolerance, 2 hour: 80 mg/dL (ref <165)
Glucose Tolerance, Fasting: 73 mg/dL (ref 65–104)
Glucose, GTT - 3 Hour: 96 mg/dL (ref <145)

## 2016-01-06 LAB — HIV ANTIBODY (ROUTINE TESTING W REFLEX): HIV: NONREACTIVE

## 2016-01-06 LAB — RPR

## 2016-01-13 ENCOUNTER — Ambulatory Visit (INDEPENDENT_AMBULATORY_CARE_PROVIDER_SITE_OTHER): Payer: Self-pay | Admitting: Family Medicine

## 2016-01-13 VITALS — BP 110/72 | HR 86 | Temp 98.1°F | Wt 218.0 lb

## 2016-01-13 DIAGNOSIS — N898 Other specified noninflammatory disorders of vagina: Secondary | ICD-10-CM

## 2016-01-13 DIAGNOSIS — O26892 Other specified pregnancy related conditions, second trimester: Secondary | ICD-10-CM

## 2016-01-13 DIAGNOSIS — O26843 Uterine size-date discrepancy, third trimester: Secondary | ICD-10-CM

## 2016-01-13 DIAGNOSIS — Z3482 Encounter for supervision of other normal pregnancy, second trimester: Secondary | ICD-10-CM

## 2016-01-13 LAB — POCT URINALYSIS DIPSTICK
Bilirubin, UA: NEGATIVE
Glucose, UA: NEGATIVE
Ketones, UA: NEGATIVE
LEUKOCYTES UA: NEGATIVE
NITRITE UA: NEGATIVE
PH UA: 7
PROTEIN UA: NEGATIVE
RBC UA: NEGATIVE
Spec Grav, UA: 1.025
UROBILINOGEN UA: 2

## 2016-01-13 LAB — POCT WET PREP (WET MOUNT)
Clue Cells Wet Prep Whiff POC: NEGATIVE
TRICHOMONAS WET PREP HPF POC: ABSENT

## 2016-01-13 NOTE — Progress Notes (Signed)
Autumn Johnson is a 25 y.o. [phone] at [redacted]w[redacted]d(via 511w6d/s) [facility]. Prenatal course, history, notes, ultrasounds, and laboratory results reviewed. Spanish interpreter utilized during today's visit.   Denies fluid leaking or vaginal bleeding. Taking PNV, no other medications.   Patient reports that yesterday she had some abdominal pain all day, located in left anterior aspect of stomach. Fetal movement was less yesterday as well. Since then, pain has resolved. No longer having any issues. Fetal movement has returned to normal. Notes FM usually improves when she sits still and quiet. Would like info on kick counts. Also reports feeling pressure/swelling feeling in her genital area. No dysuria or trouble with her urine.  PHQ-9 & pregnancy medical home forms completed, score is 0 on PHQ-9, no problems on PMH form.  Primary Prenatal Care Provider: [facility]  Prenatal Course: - ultrasounds:   6/29 - dating scan at 5w66w6da CRLPorter/28 - anatomy scan, normal, female - diabetes screening: early 1hr 138, passed early 3 hour, regular 3 hour normal - prenatal labs: Rh positive, antibody negative, rubella immune, normal 28 week labs, no GBS on urine culture - genetic screening: presented for care at 18w45w0d declined genetic screening - MAU visits: none since initiating prenatal care - vaccinations: needs vaccine for flu & Tdap at health department - weight gain: acceptable - other - treated with PO clindamycin for BV, completed course  OB/Medical History: - OB:  G:  [id number] - 2008 septic SAB at 13w355w3de patient reports this was at 19w[place] it was at 38w3d29w3d- 11/27/07 LTCS at [redacted]w[redacted]d 49w0drest of descent dilatation, NRFHT  #3 - 06/26/10 LTCS at [redacted]w[redacted]d f33w6dest of dilatation, failed TOLAC - operative report shows asymptomatic uterine dehiscence noted at  time of CS - Medical: none  Postpartum Plans: - Contraception: depo - Feeding: breast and bottle - Circumcision: n/a, female - [facility] if possible, has not yet met with Dr. Pratt  EKennon RoundsBP 110/72   Pulse 86   Temp 98.1 F (36.7 C)   Wt 218 lb (98.9 kg)   LMP 05/12/2015 (Approximate)   BMI 44.03 kg/m   Gen: NAD Abd: gravid but otherwise soft, nontender to palpation Ext: no appreciable lower extremity edema bilaterally GU: normal appearing external genitalia. Cervix visually closed. Normal white discharge. Cervix very high & posterior on exam, unable to assess dilation manually.  FHR 152 Uterine height 39cm  Current Pregnancy Issues:  1. History of LTCS x2 - patient reports wanting TOLAC. Reviewed operative report from her second C/S - it appears she had asymptomatic uterine dehiscence noted at time of surgery. Unclear whether she would be offered TOLAC given this history. [facility] to discuss [facility] planning. 2. Size > dates - measuring 39cm despite GA of [redacted]w[redacted]d. L61w6dinaccurate to some degree due to maternal morbid obesity but will obtain ultrasound to assess discrepancy 3. Abdominal pain & decreased FM yesterday - resolved today. Cervix visually closed today. Gc/chlamydia & wet prep obtained, along with UA. Discussed with Dr. Pickens, Ilda Bassether evaluation necessary.  Summary of today's plan: - ultrasound scheduled for size-dates discrepancy - visit scheduled with Dr. Pratt to Kennon Roundsss TOLAC versus elective repeat CS - testing today for abdominal pain - gc/chl, wet prep, UA, counseling given on  labor & FM - given rx for flu vaccine & Tdap at health department  Next prenatal visit in 2 weeks (in addition to visit with Dr. Kennon Rounds). Labor & fetal movement precautions discussed. Given handout on kick counts.  Chrisandra Netters, MD [place]

## 2016-01-13 NOTE — Patient Instructions (Addendum)
Getting ultrasound since you're measuring bigger than expected See appointment with Dr. Shawnie Pons to discuss vaginal delivery versus cesarean  If you have any cramping/contractions, vaginal bleeding, fluid leaking, or are worried that baby is not moving normally, go immediately to [facility] to be evaluated.   Will call you with test results  Go to health dept to get flu shot and Tdap vaccine  Be well, Dr. Pollie Meyer   Tercer trimestre de Vanetta Mulders (Third Trimester of Pregnancy) El tercer trimestre comprende desde la semana29 hasta la semana42, es decir, desde el mes7 hasta el mes9. El tercer trimestre es un perodo en el que el feto crece rpidamente. Hacia el final del noveno mes, el feto mide alrededor de 20pulgadas (45cm) de largo y pesa entre 6 y 10 libras (2,700 y 60,500kg). CAMBIOS EN EL ORGANISMO Su organismo atraviesa por muchos cambios durante el [place], y estos varan de Neomia Dear mujer a Educational psychologist.  Seguir [employer]. Es de esperar que aumente entre 25 y 35libras (11 y 16kg) hacia el final del Psychiatrist.  Podrn aparecer las primeras Albertson's caderas, el abdomen y las [place].  Puede tener necesidad de Geographical information systems officer con ms frecuencia porque el feto baja hacia la pelvis y ejerce presin sobre la vejiga.  Debido al Vanetta Mulders podr sentir Anthoney Harada estomacal con frecuencia.  Puede estar estreida, ya que ciertas hormonas enlentecen los movimientos de los msculos que [employer] desechos a travs de los intestinos.  Pueden aparecer hemorroides o abultarse e hincharse las venas (venas varicosas).  Puede sentir dolor plvico debido al [employer] y a que las hormonas del Management consultant las articulaciones entre los huesos de la pelvis. El dolor de espalda puede ser consecuencia de la sobrecarga de los msculos que soportan la [place].  Tal vez haya cambios en el cabello que pueden incluir su engrosamiento, crecimiento rpido y cambios en la textura. Adems, a algunas  mujeres se les cae el cabello durante o despus del embarazo, o tienen el cabello seco o fino. Lo ms probable es que el cabello se le normalice despus del nacimiento del beb.  Las ConAgra Foods seguirn creciendo y Development worker, community. A veces, puede haber una secrecin amarilla de las mamas llamada calostro.  El ombligo puede salir hacia afuera.  Puede sentir que le falta el aire debido a que se expande el tero.  Puede notar que el feto "baja" o lo siente ms bajo, en el abdomen.  Puede tener una prdida de secrecin mucosa con sangre. Esto suele ocurrir en el trmino de unos [address] a una semana antes de que comience el [place] de [place].  El cuello del tero se vuelve delgado y blando (se borra) cerca de la fecha de [place]. QU DEBE ESPERAR EN LOS EXMENES PRENATALES Le harn exmenes prenatales cada 2semanas hasta la semana36. A partir de ese momento le harn exmenes semanales. Durante una visita prenatal de rutina:  La pesarn para asegurarse de que usted y el feto estn creciendo normalmente.  Le tomarn la presin arterial.  Le medirn el abdomen para controlar el desarrollo del beb.  Se escucharn los latidos cardacos fetales.  Se evaluarn los resultados de los estudios solicitados en visitas anteriores.  Le revisarn el cuello del tero cuando est prxima la fecha de parto para controlar si este se ha borrado. Alrededor de la semana36, el mdico le revisar el cuello del tero. Al mismo tiempo, realizar un anlisis de las secreciones del tejido vaginal. Este examen es para determinar si hay  un tipo de bacteria, estreptococo Grupo B. El mdico le explicar esto con ms detalle. El mdico puede preguntarle lo siguiente:  Cmo le gustara que fuera el [place]o.  Cmo se siente.  Si siente los movimientos del beb.  Si ha tenido sntomas anormales, como prdida de lquido, St. Paulsangrado, dolores de cabeza intensos o clicos abdominales.  Si est consumiendo algn producto que contenga  tabaco, como cigarrillos, tabaco de Theatre managermascar y Administrator, Civil Servicecigarrillos electrnicos.  Si tiene Colgate-Palmolivealguna pregunta. Otros exmenes o estudios de deteccin que pueden realizarse durante el tercer trimestre incluyen lo siguiente:  Anlisis de sangre para controlar los niveles de hierro (anemia).  Controles fetales para determinar su salud, nivel de [place] the Grenadinesactividad y Designer, jewellerycrecimiento. Si tiene Jerseyalguna enfermedad o hay problemas durante el embarazo, le harn estudios.  Prueba del VIH (virus de inmunodeficiencia humana). Si corre Chiropodistun riesgo alto, pueden realizarle una prueba de deteccin del VIH durante el tercer trimestre del embarazo. FALSO TRABAJO DE PARTO Es posible que sienta contracciones leves e irregulares que finalmente desaparecen. Se llaman contracciones de [address] StreetBraxton Hicks o falso trabajo de Deercroftparto. Las Fifth Third Bancorpcontracciones pueden durar horas, [address]das o incluso semanas, antes de que el verdadero trabajo de parto se inicie. Si las contracciones ocurren a intervalos regulares, se intensifican o se hacen dolorosas, lo mejor es que la revise el mdico. SIGNOS DE TRABAJO DE PARTO  Clicos de tipo menstrual.  Contracciones cada 5minutos o menos.  Contracciones que comienzan en la parte superior del tero y se extienden hacia abajo, a la zona inferior del abdomen y la espalda.  Sensacin de mayor presin en la pelvis o dolor de espalda.  Una secrecin de mucosidad acuosa o con sangre que sale de la vagina. Si tiene alguno de estos signos antes de la semana37 del Psychiatristembarazo, llame a su mdico de inmediato. Debe concurrir al hospital para que la controlen inmediatamente. INSTRUCCIONES PARA EL CUIDADO EN EL HOGAR  Evite fumar, consumir hierbas, beber alcohol y tomar frmacos que no le hayan recetado. Estas sustancias qumicas afectan la formacin y el desarrollo del beb.  No consuma ningn producto que contenga tabaco, lo que incluye cigarrillos, tabaco de Theatre managermascar y Administrator, Civil Servicecigarrillos electrnicos. Si necesita ayuda para dejar de fumar,  consulte al American Expressmdico. Puede recibir asesoramiento y otro tipo de recursos para dejar de fumar.  Siga las indicaciones del mdico en relacin con el uso de medicamentos. Durante el embarazo, hay medicamentos que son seguros de tomar y otros que no.  Haga ejercicio solamente como se lo haya indicado el mdico. Sentir clicos uterinos es un buen signo para Restaurant manager, fast fooddetener la actividad fsica.  Contine comiendo alimentos sanos con regularidad.  Use un sostn que le brinde buen soporte si le Altria Groupduelen las mamas.  No se d baos de inmersin en agua caliente, baos turcos ni saunas.  Use el cinturn de seguridad en todo momento mientras conduce.  No coma carne cruda ni queso sin cocinar; evite el contacto con las bandejas sanitarias de los gatos y la tierra que estos animales usan. Estos elementos contienen grmenes que pueden causar defectos congnitos en el beb.  Tome las vitaminas prenatales.  Tome entre 1500 y 2000mg  de calcio diariamente comenzando en la semana20 del embarazo Byronhasta el parto.  Si est estreida, pruebe un laxante suave (si el mdico lo autoriza). Consuma ms alimentos ricos en fibra, como vegetales y frutas frescos y Radiation protection practitionercereales integrales. Beba gran cantidad de lquido para mantener la orina de tono claro o color amarillo plido.  Dese baos de asiento con agua  tibia para Engineer, materials o las molestias causadas por las hemorroides. Use una crema para las hemorroides si el mdico la autoriza.  Si tiene venas varicosas, use medias de descanso. Eleve los pies durante , 3 o 4veces por da. Limite el consumo de sal en su dieta.  Evite levantar objetos pesados, use zapatos de tacones bajos y [place].  Descanse con las piernas elevadas si tiene calambres o dolor de cintura.  Visite a su dentista si no lo ha Occupational hygienist. Use un cepillo de dientes blando para higienizarse los dientes y psese el hilo dental con suavidad.  Puede seguir  [employer], a menos que el mdico le indique lo contrario.  No haga viajes largos excepto que sea absolutamente necesario y solo con la autorizacin del mdico.  Tome clases prenatales para Financial trader, Education administrator y hacer preguntas sobre el [place] de parto y [place].  Haga un ensayo de la partida al hospital.  Prepare el bolso que llevar al hospital.  Prepare la habitacin del beb.  Concurra a todas las visitas prenatales segn las indicaciones de su mdico. SOLICITE ATENCIN MDICA SI:  No est segura de que est en trabajo de parto o de que ha roto la bolsa de las aguas.  Tiene mareos.  Siente clicos leves, presin en la pelvis o dolor persistente en el abdomen.  Tiene nuseas, vmitos o diarrea persistentes.  Brett Fairy secrecin vaginal con mal olor.  Siente dolor al ConocoPhillips. SOLICITE ATENCIN MDICA DE INMEDIATO SI:  Tiene fiebre.  Tiene una prdida de lquido por la vagina.  Tiene sangrado o pequeas prdidas vaginales.  Siente dolor intenso o clicos en el abdomen.  Sube o baja de peso rpidamente.  Tiene dificultad para respirar y siente dolor de pecho.  Sbitamente se le hinchan mucho el rostro, las [place], los tobillos, los pies o las piernas.  No ha sentido los movimientos del beb durante Georgianne Fick.  Siente un dolor de cabeza intenso que no se alivia con medicamentos.  Su visin se modifica. Esta informacin no tiene Theme park manager el consejo del mdico. Asegrese de hacerle al mdico cualquier pregunta que tenga. Document Released: 11/02/2004 Document Revised: 02/13/2014 Document Reviewed: 03/26/2012 Elsevier Interactive Patient Education  2017 Elsevier Inc.   Evaluacin de los movimientos fetales  (Fetal Movement Counts) Nombre del paciente: __________________________________________________ Micheline Chapman estimada: ____________________ Caroleen Hamman de los movimientos fetales es muy recomendable en los embarazos de alto  riesgo, pero tambin es una buena idea que lo hagan todas las [place]. El Firefighter que comience a contarlos a las 28 semanas de [place]. Los movimientos fetales suelen aumentar:   Despus de Animator.  Despus de la actividad fsica.  Despus de comer o beber [employer] o fro.  En reposo. Preste atencin cuando sienta que el beb est ms activo. Esto le ayudar a notar un patrn de ciclos de vigilia y sueo de su beb y cules son los factores que contribuyen a un aumento de los movimientos fetales. Es importante llevar a cabo un recuento de movimientos fetales, al mismo tiempo cada da, cuando el beb normalmente est ms activo.  CMO CONTAR LOS MOVIMIENTOS FETALES 1. Busque un lugar tranquilo y cmodo para sentarse o recostarse sobre el lado izquierdo. Al recostarse sobre su lado izquierdo, le proporciona una mejor circulacin de [place] y oxgeno al beb. 2. Anote el da y la hora en una hoja de papel o en un diario.  3. Comience contando las pataditas, revoloteos, chasquidos, vueltas o pinchazos en un perodo de 2 horas. Debe sentir al menos 10 movimientos en 2 horas. 4. Si no siente 10 movimientos en 2 horas, espere 2  3 horas y cuente de nuevo. Busque cambios en el patrn o si no cuenta lo suficiente en 2 horas. SOLICITE ATENCIN MDICA SI:   Siente menos de 10 pataditas en 2 horas, en dos intentos.  No hay movimientos durante una hora.  El patrn se modifica o le lleva ms tiempo Art gallery managercada da contar las 10 pataditas.  Siente que el beb no se mueve como lo hace habitualmente. Fecha: ____________ Movimientos: ____________ Stevan BornHora de inicio: ____________ Stevan BornHora de finalizacin: ____________  Franco NonesFecha: ____________ Movimientos: ____________ Stevan BornHora de inicio: ____________ Stevan BornHora de finalizacin: ____________  Franco NonesFecha: ____________ Movimientos: ____________ Stevan BornHora de inicio: ____________ Stevan BornHora de finalizacin: ____________  Franco NonesFecha: ____________ Movimientos: ____________ Stevan BornHora de  inicio: ____________ Stevan BornHora de finalizacin: ____________  Franco NonesFecha: ____________ Movimientos: ____________ Stevan BornHora de inicio: ____________ Stevan BornHora de finalizacin: ____________  Franco NonesFecha: ____________ Movimientos: ____________ Mammie RussianHora de inicio: ____________ Mammie RussianHora de finalizacin: ____________  Franco NonesFecha: ____________ Movimientos: ____________ Mammie RussianHora de inicio: ____________ Mammie RussianHora de finalizacin: ____________  Franco NonesFecha: ____________ Movimientos: ____________ Mammie RussianHora de inicio: ____________ Mammie RussianHora de finalizacin: ____________  Franco NonesFecha: ____________ Movimientos: ____________ Mammie RussianHora de inicio: ____________ Mammie RussianHora de finalizacin: ____________  Franco NonesFecha: ____________ Movimientos: ____________ Mammie RussianHora de inicio: ____________ Mammie RussianHora de finalizacin: ____________  Franco NonesFecha: ____________ Movimientos: ____________ Mammie RussianHora de inicio: ____________ Mammie RussianHora de finalizacin: ____________  Franco NonesFecha: ____________ Movimientos: ____________ Mammie RussianHora de inicio: ____________ Mammie RussianHora de finalizacin: ____________  Franco NonesFecha: ____________ Movimientos: ____________ Mammie RussianHora de inicio: ____________ Mammie RussianHora de finalizacin: ____________  Franco NonesFecha: ____________ Movimientos: ____________ Mammie RussianHora de inicio: ____________ Mammie RussianHora de finalizacin: ____________  Franco NonesFecha: ____________ Movimientos: ____________ Mammie RussianHora de inicio: ____________ Mammie RussianHora de finalizacin: ____________  Franco NonesFecha: ____________ Movimientos: ____________ Mammie RussianHora de inicio: ____________ Mammie RussianHora de finalizacin: ____________  Franco NonesFecha: ____________ Movimientos: ____________ Mammie RussianHora de inicio: ____________ Mammie RussianHora de finalizacin: ____________  Franco NonesFecha: ____________ Movimientos: ____________ Mammie RussianHora de inicio: ____________ Mammie RussianHora de finalizacin: ____________  Franco NonesFecha: ____________ Movimientos: ____________ Mammie RussianHora de inicio: ____________ Mammie RussianHora de finalizacin: ____________  Franco NonesFecha: ____________ Movimientos: ____________ Mammie RussianHora de inicio: ____________ Mammie RussianHora de finalizacin: ____________  Franco NonesFecha: ____________ Movimientos: ____________ Mammie RussianHora de inicio: ____________ Mammie RussianHora de finalizacin:  ____________  Franco NonesFecha: ____________ Movimientos: ____________ Mammie RussianHora de inicio: ____________ Mammie RussianHora de finalizacin: ____________  Franco NonesFecha: ____________ Movimientos: ____________ Mammie RussianHora de inicio: ____________ Mammie RussianHora de finalizacin: ____________  Franco NonesFecha: ____________ Movimientos: ____________ Mammie RussianHora de inicio: ____________ Mammie RussianHora de finalizacin: ____________  Franco NonesFecha: ____________ Movimientos: ____________ Mammie RussianHora de inicio: ____________ Mammie RussianHora de finalizacin: ____________  Franco NonesFecha: ____________ Movimientos: ____________ Mammie RussianHora de inicio: ____________ Mammie RussianHora de finalizacin: ____________  Franco NonesFecha: ____________ Movimientos: ____________ Mammie RussianHora de inicio: ____________ Mammie RussianHora de finalizacin: ____________  Franco NonesFecha: ____________ Movimientos: ____________ Mammie RussianHora de inicio: ____________ Mammie RussianHora de finalizacin: ____________  Franco NonesFecha: ____________ Movimientos: ____________ Mammie RussianHora de inicio: ____________ Mammie RussianHora de finalizacin: ____________  Franco NonesFecha: ____________ Movimientos: ____________ Mammie RussianHora de inicio: ____________ Mammie RussianHora de finalizacin: ____________  Franco NonesFecha: ____________ Movimientos: ____________ Mammie RussianHora de inicio: ____________ Mammie RussianHora de finalizacin: ____________  Franco NonesFecha: ____________ Movimientos: ____________ Mammie RussianHora de inicio: ____________ Mammie RussianHora de finalizacin: ____________  Franco NonesFecha: ____________ Movimientos: ____________ Mammie RussianHora de inicio: ____________ Mammie RussianHora de finalizacin: ____________  Franco NonesFecha: ____________ Movimientos: ____________ Mammie RussianHora de inicio: ____________ Mammie RussianHora de finalizacin: ____________  Franco NonesFecha: ____________ Movimientos: ____________ Mammie RussianHora de inicio: ____________ Mammie RussianHora de finalizacin: ____________  Franco NonesFecha: ____________ Movimientos: ____________ Mammie RussianHora de inicio: ____________ Mammie RussianHora de finalizacin: ____________  Franco NonesFecha: ____________ Movimientos: ____________ Mammie RussianHora de inicio: ____________ Mammie RussianHora de finalizacin: ____________  Franco NonesFecha: ____________ Movimientos: ____________ Mammie RussianHora de inicio: ____________  Hora de finalizacin: ____________  Franco Nones: ____________  Movimientos: ____________ Stevan Born inicio: ____________ Stevan Born finalizacin: ____________  Franco Nones: ____________ Movimientos: ____________ Stevan Born inicio: ____________ Stevan Born finalizacin: ____________  Franco Nones: ____________ Movimientos: ____________ Stevan Born inicio: ____________ Stevan Born finalizacin: ____________  Franco Nones: ____________ Movimientos: ____________ Stevan Born inicio: ____________ Stevan Born finalizacin: ____________  Franco Nones: ____________ Movimientos: ____________ Stevan Born inicio: ____________ Mammie Russian de finalizacin: ____________  Franco Nones: ____________ Movimientos: ____________ Mammie Russian de inicio: ____________ Mammie Russian de finalizacin: ____________  Franco Nones: ____________ Movimientos: ____________ Mammie Russian de inicio: ____________ Mammie Russian de finalizacin: ____________  Franco Nones: ____________ Movimientos: ____________ Mammie Russian de inicio: ____________ Mammie Russian de finalizacin: ____________  Franco Nones: ____________ Movimientos: ____________ Mammie Russian de inicio: ____________ Mammie Russian de finalizacin: ____________  Franco Nones: ____________ Movimientos: ____________ Mammie Russian de inicio: ____________ Mammie Russian de finalizacin: ____________  Franco Nones: ____________ Movimientos: ____________ Mammie Russian de inicio: ____________ Mammie Russian de finalizacin: ____________  Franco Nones: ____________ Movimientos: ____________ Mammie Russian de inicio: ____________ Mammie Russian de finalizacin: ____________  Franco Nones: ____________ Movimientos: ____________ Mammie Russian de inicio: ____________ Mammie Russian de finalizacin: ____________  Franco Nones: ____________ Movimientos: ____________ Mammie Russian de inicio: ____________ Mammie Russian de finalizacin: ____________  Franco Nones: ____________ Movimientos: ____________ Mammie Russian de inicio: ____________ Mammie Russian de finalizacin: ____________  Franco Nones: ____________ Movimientos: ____________ Mammie Russian de inicio: ____________ Mammie Russian de finalizacin: ____________  Franco Nones: ____________ Movimientos: ____________ Stevan Born inicio: ____________ Stevan Born finalizacin: ____________  Franco Nones: ____________ Movimientos: ____________ Stevan Born inicio:  ____________ Mammie Russian de finalizacin: ____________  Esta informacin no tiene como fin reemplazar el consejo del mdico. Asegrese de hacerle al mdico cualquier pregunta que tenga. Document Released: 05/02/2007 Document Revised: 01/10/2012 Elsevier Interactive Patient Education  2017 [employer].

## 2016-01-14 LAB — CERVICOVAGINAL ANCILLARY ONLY
CHLAMYDIA, DNA PROBE: NEGATIVE
NEISSERIA GONORRHEA: NEGATIVE

## 2016-01-19 ENCOUNTER — Encounter: Payer: Self-pay | Admitting: Family Medicine

## 2016-01-20 ENCOUNTER — Encounter: Payer: Self-pay | Admitting: Family Medicine

## 2016-01-25 ENCOUNTER — Encounter: Payer: Self-pay | Admitting: Internal Medicine

## 2016-01-28 ENCOUNTER — Ambulatory Visit (INDEPENDENT_AMBULATORY_CARE_PROVIDER_SITE_OTHER): Payer: Self-pay | Admitting: Internal Medicine

## 2016-01-28 VITALS — BP 90/60 | HR 72 | Temp 98.0°F | Wt 213.0 lb

## 2016-01-28 DIAGNOSIS — Z3482 Encounter for supervision of other normal pregnancy, second trimester: Secondary | ICD-10-CM

## 2016-01-28 NOTE — Patient Instructions (Signed)
  Fue tan agradable verte! Su ecografa fue completamente normal y [employer] que su beb tena un tamao normal.  Asegrese de ir a su cita con la Dra. Beaulah Dinningratt el 12/29 para hablar sobre una cesrea o un parto vaginal.  Lo veremos de regreso en 2 semanas para su prxima cita.

## 2016-01-30 NOTE — Progress Notes (Signed)
Autumn Johnson is a 25 y.o. [id number] at 779w2d here for routine follow up.  She reports regular fetal movement. She denies vaginal bleeding, leakage of fluids, or contractions. She was previously having some mild abdominal pain around the time she was seen for her last prenatal visit, but this has since resolved. Labs were performed at that time, including GC/Chlamydia, UA, and wet prep, all of which were negative. See flow sheet for details.  A/P: Pregnancy at 9079w2d.  Doing well.   Pregnancy issues include measuring size > dates at last prenatal visit. Pt was sent for US to assess growth and baby's size was consistent with 29.4 weeks (which is how far along she was when she had the ultrasound performed).   Infant feeding choice: Breast Contraception choice: Depo Infant circumcision desired: not applicable  Tdap was not given today. Pt is an adopt-a-mom and was given rx to receive tdap and flu vaccines [facility].  Preterm labor and fetal movement precautions reviewed. Pt has appointment scheduled with Dr. Shawnie PonsPratt for delivery planning. She has a history of C-section x 2 with failed TOLAC x 1 but wants to discuss TOLAC again. Safe sleep discussed. Follow up 2 weeks.

## 2016-02-04 ENCOUNTER — Ambulatory Visit (INDEPENDENT_AMBULATORY_CARE_PROVIDER_SITE_OTHER): Payer: Self-pay | Admitting: Family Medicine

## 2016-02-04 VITALS — BP 92/58 | HR 84 | Temp 98.2°F | Wt 216.0 lb

## 2016-02-04 DIAGNOSIS — O34219 Maternal care for unspecified type scar from previous cesarean delivery: Secondary | ICD-10-CM

## 2016-02-04 DIAGNOSIS — Z3483 Encounter for supervision of other normal pregnancy, third trimester: Secondary | ICD-10-CM

## 2016-02-04 NOTE — Progress Notes (Signed)
   PRENATAL VISIT NOTE  Subjective:  Autumn Johnson is a 25 y.o. [id number] at 2737w0d being seen today for ongoing prenatal care.  She is currently monitored for the following issues for this low-risk pregnancy and has Other malaise and fatigue; Previous cesarean delivery affecting pregnancy, antepartum; Language barrier, cultural differences; and Encounter for supervision of other normal pregnancy on her problem list.  Patient reports no complaints.  Contractions: Not present.  .  Movement: Present. Denies leaking of fluid.   The following portions of the patient's history were reviewed and updated as appropriate: allergies, current medications, past family history, past medical history, past social history, past surgical history and problem list. Problem list updated.  Objective:   Vitals:   02/04/16 1440  BP: (!) 92/58  Pulse: 84  Temp: 98.2 F (36.8 C)  Weight: 216 lb (98 kg)    Fetal Status:           General:  Alert, oriented and cooperative. Patient is in no acute distress.  Skin: Skin is warm and dry. No rash noted.   Cardiovascular: Normal heart rate noted  Respiratory: Normal respiratory effort, no problems with respiration noted  Abdomen: Soft, gravid, appropriate for gestational age.       Pelvic:  Cervical exam deferred        Extremities: Normal range of motion.     Mental Status: Normal mood and affect. Normal behavior. Normal judgment and thought content.   Assessment and Plan:  Pregnancy: [id number] at 2737w0d  1. Previous cesarean delivery affecting pregnancy, antepartum [facility] Attending Consult Note  25 y.o. [phone] at 2737w0d with Estimated Date of Delivery: 03/31/16 was seen today in office to discuss trial of labor after cesarean section (TOLAC) versus elective repeat cesarean delivery (ERCD). The following risks were discussed with the patient.  Patient with 2 prior  C-sections and last TOLAC had uterine dehiscence--advised to have 3rd  ERLTCS--scheduled at 39 wks.   [facility]  2. Encounter for supervision of other normal pregnancy in third trimester Continue routine prenatal care.  Preterm labor symptoms and general obstetric precautions including but not limited to vaginal bleeding, contractions, leaking of fluid and fetal movement were reviewed in detail with the patient. Please refer to After Visit Summary for other counseling recommendations.  Return in 2 weeks (on 02/18/2016).   Reva Boresanya S Huriel Matt, MD

## 2016-02-04 NOTE — Patient Instructions (Signed)
Tercer trimestre de embarazo (Third Trimester of Pregnancy) El tercer trimestre comprende desde la semana29 hasta la semana42, es decir, desde el mes7 hasta el mes9. El tercer trimestre es un perodo en el que el feto crece rpidamente. Hacia el final del noveno mes, el feto mide alrededor de 20pulgadas (45cm) de largo y pesa entre 6 y 10 libras (2,700 y 4,500kg). CAMBIOS EN EL ORGANISMO Su organismo atraviesa por muchos cambios durante el embarazo, y estos varan de una mujer a otra.  Seguir aumentando de peso. Es de esperar que aumente entre 25 y 35libras (11 y 16kg) hacia el final del embarazo.  Podrn aparecer las primeras estras en las caderas, el abdomen y las mamas.  Puede tener necesidad de orinar con ms frecuencia porque el feto baja hacia la pelvis y ejerce presin sobre la vejiga.  Debido al embarazo podr sentir acidez estomacal con frecuencia.  Puede estar estreida, ya que ciertas hormonas enlentecen los movimientos de los msculos que empujan los desechos a travs de los intestinos.  Pueden aparecer hemorroides o abultarse e hincharse las venas (venas varicosas).  Puede sentir dolor plvico debido al aumento de peso y a que las hormonas del embarazo relajan las articulaciones entre los huesos de la pelvis. El dolor de espalda puede ser consecuencia de la sobrecarga de los msculos que soportan la postura.  Tal vez haya cambios en el cabello que pueden incluir su engrosamiento, crecimiento rpido y cambios en la textura. Adems, a algunas mujeres se les cae el cabello durante o despus del embarazo, o tienen el cabello seco o fino. Lo ms probable es que el cabello se le normalice despus del nacimiento del beb.  Las mamas seguirn creciendo y le dolern. A veces, puede haber una secrecin amarilla de las mamas llamada calostro.  El ombligo puede salir hacia afuera.  Puede sentir que le falta el aire debido a que se expande el tero.  Puede notar que el feto  "baja" o lo siente ms bajo, en el abdomen.  Puede tener una prdida de secrecin mucosa con sangre. Esto suele ocurrir en el trmino de unos pocos das a una semana antes de que comience el trabajo de parto.  El cuello del tero se vuelve delgado y blando (se borra) cerca de la fecha de parto. QU DEBE ESPERAR EN LOS EXMENES PRENATALES Le harn exmenes prenatales cada 2semanas hasta la semana36. A partir de ese momento le harn exmenes semanales. Durante una visita prenatal de rutina:  La pesarn para asegurarse de que usted y el feto estn creciendo normalmente.  Le tomarn la presin arterial.  Le medirn el abdomen para controlar el desarrollo del beb.  Se escucharn los latidos cardacos fetales.  Se evaluarn los resultados de los estudios solicitados en visitas anteriores.  Le revisarn el cuello del tero cuando est prxima la fecha de parto para controlar si este se ha borrado. Alrededor de la semana36, el mdico le revisar el cuello del tero. Al mismo tiempo, realizar un anlisis de las secreciones del tejido vaginal. Este examen es para determinar si hay un tipo de bacteria, estreptococo Grupo B. El mdico le explicar esto con ms detalle. El mdico puede preguntarle lo siguiente:  Cmo le gustara que fuera el parto.  Cmo se siente.  Si siente los movimientos del beb.  Si ha tenido sntomas anormales, como prdida de lquido, sangrado, dolores de cabeza intensos o clicos abdominales.  Si est consumiendo algn producto que contenga tabaco, como cigarrillos, tabaco de mascar y   cigarrillos electrnicos.  Si tiene alguna pregunta. Otros exmenes o estudios de deteccin que pueden realizarse durante el tercer trimestre incluyen lo siguiente:  Anlisis de sangre para controlar los niveles de hierro (anemia).  Controles fetales para determinar su salud, nivel de actividad y crecimiento. Si tiene alguna enfermedad o hay problemas durante el embarazo, le harn  estudios.  Prueba del VIH (virus de inmunodeficiencia humana). Si corre un riesgo alto, pueden realizarle una prueba de deteccin del VIH durante el tercer trimestre del embarazo. FALSO TRABAJO DE PARTO Es posible que sienta contracciones leves e irregulares que finalmente desaparecen. Se llaman contracciones de Braxton Hicks o falso trabajo de parto. Las contracciones pueden durar horas, das o incluso semanas, antes de que el verdadero trabajo de parto se inicie. Si las contracciones ocurren a intervalos regulares, se intensifican o se hacen dolorosas, lo mejor es que la revise el mdico. SIGNOS DE TRABAJO DE PARTO  Clicos de tipo menstrual.  Contracciones cada 5minutos o menos.  Contracciones que comienzan en la parte superior del tero y se extienden hacia abajo, a la zona inferior del abdomen y la espalda.  Sensacin de mayor presin en la pelvis o dolor de espalda.  Una secrecin de mucosidad acuosa o con sangre que sale de la vagina. Si tiene alguno de estos signos antes de la semana37 del embarazo, llame a su mdico de inmediato. Debe concurrir al hospital para que la controlen inmediatamente. INSTRUCCIONES PARA EL CUIDADO EN EL HOGAR  Evite fumar, consumir hierbas, beber alcohol y tomar frmacos que no le hayan recetado. Estas sustancias qumicas afectan la formacin y el desarrollo del beb.  No consuma ningn producto que contenga tabaco, lo que incluye cigarrillos, tabaco de mascar y cigarrillos electrnicos. Si necesita ayuda para dejar de fumar, consulte al mdico. Puede recibir asesoramiento y otro tipo de recursos para dejar de fumar.  Siga las indicaciones del mdico en relacin con el uso de medicamentos. Durante el embarazo, hay medicamentos que son seguros de tomar y otros que no.  Haga ejercicio solamente como se lo haya indicado el mdico. Sentir clicos uterinos es un buen signo para detener la actividad fsica.  Contine comiendo alimentos sanos con  regularidad.  Use un sostn que le brinde buen soporte si le duelen las mamas.  No se d baos de inmersin en agua caliente, baos turcos ni saunas.  Use el cinturn de seguridad en todo momento mientras conduce.  No coma carne cruda ni queso sin cocinar; evite el contacto con las bandejas sanitarias de los gatos y la tierra que estos animales usan. Estos elementos contienen grmenes que pueden causar defectos congnitos en el beb.  Tome las vitaminas prenatales.  Tome entre 1500 y 2000mg de calcio diariamente comenzando en la semana20 del embarazo hasta el parto.  Si est estreida, pruebe un laxante suave (si el mdico lo autoriza). Consuma ms alimentos ricos en fibra, como vegetales y frutas frescos y cereales integrales. Beba gran cantidad de lquido para mantener la orina de tono claro o color amarillo plido.  Dese baos de asiento con agua tibia para aliviar el dolor o las molestias causadas por las hemorroides. Use una crema para las hemorroides si el mdico la autoriza.  Si tiene venas varicosas, use medias de descanso. Eleve los pies durante 15minutos, 3 o 4veces por da. Limite el consumo de sal en su dieta.  Evite levantar objetos pesados, use zapatos de tacones bajos y mantenga una buena postura.  Descanse con las piernas elevadas si tiene   calambres o dolor de cintura.  Visite a su dentista si no lo ha hecho durante el embarazo. Use un cepillo de dientes blando para higienizarse los dientes y psese el hilo dental con suavidad.  Puede seguir manteniendo relaciones sexuales, a menos que el mdico le indique lo contrario.  No haga viajes largos excepto que sea absolutamente necesario y solo con la autorizacin del mdico.  Tome clases prenatales para entender, practicar y hacer preguntas sobre el trabajo de parto y el parto.  Haga un ensayo de la partida al hospital.  Prepare el bolso que llevar al hospital.  Prepare la habitacin del beb.  Concurra a todas  las visitas prenatales segn las indicaciones de su mdico.  SOLICITE ATENCIN MDICA SI:  No est segura de que est en trabajo de parto o de que ha roto la bolsa de las aguas.  Tiene mareos.  Siente clicos leves, presin en la pelvis o dolor persistente en el abdomen.  Tiene nuseas, vmitos o diarrea persistentes.  Observa una secrecin vaginal con mal olor.  Siente dolor al orinar.  SOLICITE ATENCIN MDICA DE INMEDIATO SI:  Tiene fiebre.  Tiene una prdida de lquido por la vagina.  Tiene sangrado o pequeas prdidas vaginales.  Siente dolor intenso o clicos en el abdomen.  Sube o baja de peso rpidamente.  Tiene dificultad para respirar y siente dolor de pecho.  Sbitamente se le hinchan mucho el rostro, las manos, los tobillos, los pies o las piernas.  No ha sentido los movimientos del beb durante una hora.  Siente un dolor de cabeza intenso que no se alivia con medicamentos.  Su visin se modifica.  Esta informacin no tiene como fin reemplazar el consejo del mdico. Asegrese de hacerle al mdico cualquier pregunta que tenga. Document Released: 11/02/2004 Document Revised: 02/13/2014 Document Reviewed: 03/26/2012 Elsevier Interactive Patient Education  2017 Else[employer] Inc.   Lactancia materna (Breastfeeding) Decidir amamantar es una de las mejores elecciones que puede hacer por usted y su beb. El cambio hormonal durante el embarazo produce el desarrollo del tejido mamario y aumenta la cantidad y el tamao de los conductos galactforos. Estas hormonas tambin permiten que las protenas, los azcares y las grasas de la sangre produzcan la leche materna en las glndulas productoras de leche. Las hormonas impiden que la leche materna sea liberada antes del nacimiento del beb, adems de impulsar el flujo de leche luego del nacimiento. Una vez que ha comenzado a amamantar, pensar en el beb, as como la succin o el llanto, pueden estimular la liberacin de leche  de las glndulas productoras de leche. LOS BENEFICIOS DE AMAMANTAR Para el beb  La primera leche (calostro) ayuda a mejorar el funcionamiento del sistema digestivo del beb.  La leche tiene anticuerpos que ayudan a prevenir las infecciones en el beb.  El beb tiene una menor incidencia de asma, alergias y del sndrome de muerte sbita del lactante.  Los nutrientes en la leche materna son mejores para el beb que la leche maternizada y estn preparados exclusivamente para cubrir las necesidades del beb.  La leche materna mejora el desarrollo cerebral del beb.  Es menos probable que el beb desarrolle otras enfermedades, como obesidad infantil, asma o diabetes mellitus de tipo 2. Para usted  La lactancia materna favorece el desarrollo de un vnculo muy especial entre la madre y el beb.  Es conveniente. La leche materna siempre est disponible a la temperatura correcta y es econmica.  La lactancia materna ayuda a quemar caloras y   a perder el peso ganado durante el embarazo.  Favorece la contraccin del tero al tamao que tena antes del embarazo de manera ms rpida y disminuye el sangrado (loquios) despus del parto.  La lactancia materna contribuye a reducir el riesgo de desarrollar diabetes mellitus de tipo 2, osteoporosis o cncer de mama o de ovario en el futuro. SIGNOS DE QUE EL BEB EST HAMBRIENTO Primeros signos de hambre  Aumenta su estado de alerta o actividad.  Se estira.  Mueve la cabeza de un lado a otro.  Mueve la cabeza y abre la boca cuando se le toca la mejilla o la comisura de la boca (reflejo de bsqueda).  Aumenta las vocalizaciones, tales como sonidos de succin, se relame los labios, emite arrullos, suspiros, o chirridos.  Mueve la mano hacia la boca.  Se chupa con ganas los dedos o las manos. Signos tardos de hambre  Est agitado.  Llora de manera intermitente. Signos de hambre extrema Los signos de hambre extrema requerirn que lo calme y  lo consuele antes de que el beb pueda alimentarse adecuadamente. No espere a que se manifiesten los siguientes signos de hambre extrema para comenzar a amamantar:  Agitacin.  Llanto intenso y fuerte.  Gritos. INFORMACIN BSICA SOBRE LA LACTANCIA MATERNA Iniciacin de la lactancia materna  Encuentre un lugar cmodo para sentarse o acostarse, con un buen respaldo para el cuello y la espalda.  Coloque una almohada o una manta enrollada debajo del beb para acomodarlo a la altura de la mama (si est sentada). Las almohadas para amamantar se han diseado especialmente a fin de servir de apoyo para los brazos y el beb mientras amamanta.  Asegrese de que el abdomen del beb est frente al suyo.  Masajee suavemente la mama. Con las yemas de los dedos, masajee la pared del pecho hacia el pezn en un movimiento circular. Esto estimula el flujo de leche. Es posible que deba continuar este movimiento mientras amamanta si la leche fluye lentamente.  Sostenga la mama con el pulgar por arriba del pezn y los otros 4 dedos por debajo de la mama. Asegrese de que los dedos se encuentren lejos del pezn y de la boca del beb.  Empuje suavemente los labios del beb con el pezn o con el dedo.  Cuando la boca del beb se abra lo suficiente, acrquelo rpidamente a la mama e introduzca todo el pezn y la zona oscura que lo rodea (areola), tanto como sea posible, dentro de la boca del beb. ? Debe haber ms areola visible por arriba del labio superior del beb que por debajo del labio inferior. ? La lengua del beb debe estar entre la enca inferior y la mama.  Asegrese de que la boca del beb est en la posicin correcta alrededor del pezn (prendida). Los labios del beb deben crear un sello sobre la mama y estar doblados hacia afuera (invertidos).  Es comn que el beb succione durante 2 a 3 minutos para que comience el flujo de leche materna. Cmo debe prenderse Es muy importante que le ensee al  beb cmo prenderse adecuadamente a la mama. Si el beb no se prende adecuadamente, puede causarle dolor en el pezn y reducir la produccin de leche materna, y hacer que el beb tenga un escaso aumento de peso. Adems, si el beb no se prende adecuadamente al pezn, puede tragar aire durante la alimentacin. Esto puede causarle molestias al beb. Hacer eructar al beb al cambiar de mama puede ayudarlo a liberar el   aire. Sin embargo, ensearle al beb cmo prenderse a la mama adecuadamente es la mejor manera de evitar que se sienta molesto por tragar aire mientras se alimenta. Signos de que el beb se ha prendido adecuadamente al pezn:  Tironea o succiona de modo silencioso, sin causarle dolor.  Se escucha que traga cada 3 o 4 succiones.  Hay movimientos musculares por arriba y por delante de sus odos al succionar. Signos de que el beb no se ha prendido adecuadamente al pezn:  Hace ruidos de succin o de chasquido mientras se alimenta.  Siente dolor en el pezn. Si cree que el beb no se prendi correctamente, deslice el dedo en la comisura de la boca y colquelo entre las encas del beb para interrumpir la succin. Intente comenzar a amamantar nuevamente. Signos de lactancia materna exitosa Signos del beb:  Disminuye gradualmente el nmero de succiones o cesa la succin por completo.  Se duerme.  Relaja el cuerpo.  Retiene una pequea cantidad de leche en la boca.  Se desprende solo del pecho. Signos que presenta usted:  Las mamas han aumentado la firmeza, el peso y el tamao 1 a 3 horas despus de amamantar.  Estn ms blandas inmediatamente despus de amamantar.  Un aumento del volumen de leche, y tambin un cambio en su consistencia y color se producen hacia el quinto da de lactancia materna.  Los pezones no duelen, ni estn agrietados ni sangran. Signos de que su beb recibe la cantidad de leche suficiente  Mojar por lo menos 1 o 2 paales durante las primeras 24  horas despus del nacimiento.  Mojar por lo menos 5 o 6 paales cada 24 horas durante la primera semana despus del nacimiento. La orina debe ser transparente o de color amarillo plido a los 5 das despus del nacimiento.  Mojar entre 6 y 8 paales cada 24 horas a medida que el beb sigue creciendo y desarrollndose.  Defeca al menos 3 veces en 24 horas a los 5 das de vida. La materia fecal debe ser blanda y amarillenta.  Defeca al menos 3 veces en 24 horas a los 7 das de vida. La materia fecal debe ser grumosa y amarillenta.  No registra una prdida de peso mayor del 10% del peso al nacer durante los primeros 3 das de vida.  Aumenta de peso un promedio de 4 a 7onzas (113 a 198g) por semana despus de los 4 das de vida.  Aumenta de peso, diariamente, de manera uniforme a partir de los 5 das de vida, sin registrar prdida de peso despus de las 2semanas de vida. Despus de alimentarse, es posible que el beb regurgite una pequea cantidad. Esto es frecuente. FRECUENCIA Y DURACIN DE LA LACTANCIA MATERNA El amamantamiento frecuente la ayudar a producir ms leche y a prevenir problemas de dolor en los pezones e hinchazn en las mamas. Alimente al beb cuando muestre signos de hambre o si siente la necesidad de reducir la congestin de las mamas. Esto se denomina "lactancia a demanda". Evite el uso del chupete mientras trabaja para establecer la lactancia (las primeras 4 a 6 semanas despus del nacimiento del beb). Despus de este perodo, podr ofrecerle un chupete. Las investigaciones demostraron que el uso del chupete durante el primer ao de vida del beb disminuye el riesgo de desarrollar el sndrome de muerte sbita del lactante (SMSL). Permita que el nio se alimente en cada mama todo lo que desee. Contine amamantando al beb hasta que haya terminado de alimentarse. Cuando   el beb se desprende o se queda dormido mientras se est alimentando de la primera mama, ofrzcale la segunda.  Debido a que, con frecuencia, los recin nacidos permanecen somnolientos las primeras semanas de vida, es posible que deba despertar al beb para alimentarlo. Los horarios de lactancia varan de un beb a otro. Sin embargo, las siguientes reglas pueden servir como gua para ayudarla a garantizar que el beb se alimenta adecuadamente:  Se puede amamantar a los recin nacidos (bebs de 4 semanas o menos de vida) cada 1 a 3 horas.  No deben transcurrir ms de 3 horas durante el da o 5 horas durante la noche sin que se amamante a los recin nacidos.  Debe amamantar al beb 8 veces como mnimo en un perodo de 24 horas, hasta que comience a introducir slidos en su dieta, a los 6 meses de vida aproximadamente. EXTRACCIN DE LECHE MATERNA La extraccin y el almacenamiento de la leche materna le permiten asegurarse de que el beb se alimente exclusivamente de leche materna, aun en momentos en los que no puede amamantar. Esto tiene especial importancia si debe regresar al trabajo en el perodo en que an est amamantando o si no puede estar presente en los momentos en que el beb debe alimentarse. Su asesor en lactancia puede orientarla sobre cunto tiempo es seguro almacenar leche materna. El sacaleche es un aparato que le permite extraer leche de la mama a un recipiente estril. Luego, la leche materna extrada puede almacenarse en un refrigerador o congelador. Algunos sacaleches son manuales, mientras que otros son elctricos. Consulte a su asesor en lactancia qu tipo ser ms conveniente para usted. Los sacaleches se pueden comprar; sin embargo, algunos hospitales y grupos de apoyo a la lactancia materna alquilan sacaleches mensualmente. Un asesor en lactancia puede ensearle cmo extraer leche materna manualmente, en caso de que prefiera no usar un sacaleche. CMO CUIDAR LAS MAMAS DURANTE LA LACTANCIA MATERNA Los pezones se secan, agrietan y duelen durante la lactancia materna. Las siguientes  recomendaciones pueden ayudarla a mantener las mamas humectadas y sanas:  Evite usar jabn en los pezones.  Use un sostn de soporte. Aunque no son esenciales, las camisetas sin mangas o los sostenes especiales para amamantar estn diseados para acceder fcilmente a las mamas, para amamantar sin tener que quitarse todo el sostn o la camiseta. Evite usar sostenes con aro o sostenes muy ajustados.  Seque al aire sus pezones durante 3 a 4minutos despus de amamantar al beb.  Utilice solo apsitos de algodn en el sostn para absorber las prdidas de leche. La prdida de un poco de leche materna entre las tomas es normal.  Utilice lanolina sobre los pezones luego de amamantar. La lanolina ayuda a mantener la humedad normal de la piel. Si usa lanolina pura, no tiene que lavarse los pezones antes de volver a alimentar al beb. La lanolina pura no es txica para el beb. Adems, puede extraer manualmente algunas gotas de leche materna y masajear suavemente esa leche sobre los pezones, para que la leche se seque al aire. Durante las primeras semanas despus de dar a luz, algunas mujeres pueden experimentar hinchazn en las mamas (congestin mamaria). La congestin puede hacer que sienta las mamas pesadas, calientes y sensibles al tacto. El pico de la congestin ocurre dentro de los 3 a 5 das despus del parto. Las siguientes recomendaciones pueden ayudarla a aliviar la congestin:  Vace por completo las mamas al amamantar o extraer leche. Puede aplicar calor hmedo en las mamas (  aplicar calor hmedo en las mamas (en la ducha o con toallas hmedas para manos) antes de Museum/gallery exhibitions officer o extraer [employer]. Esto aumenta la circulacin y [place] a que la [place]. Si el beb no vaca por completo las [address] cuando lo [address], extraiga la [place] restante despus de que haya finalizado.  Use un sostn ajustado (para amamantar o comn) o una camiseta sin mangas durante 1 o 2 das para indicar al cuerpo que disminuya ligeramente la produccin de  [place].  Aplique compresas de hielo [employer], a menos que le resulte demasiado incmodo.  Asegrese de que el beb est prendido y se encuentre en la posicin correcta mientras lo alimenta. Si la congestin persiste luego de 48 horas o despus de seguir estas recomendaciones, comunquese con su mdico o un Holiday representative. RECOMENDACIONES GENERALES PARA EL CUIDADO DE LA SALUD DURANTE LA LACTANCIA MATERNA  Consuma alimentos saludables. Alterne comidas y colaciones, y coma 3 de cada una por da. Dado que lo que come [employer], es posible que algunas comidas hagan que su beb se vuelva ms irritable de lo habitual. Evite comer este tipo de alimentos si percibe que afectan de manera negativa al beb.  Beba leche, jugos de fruta y agua para Patent examiner su sed (aproximadamente 10 vasos al Futures trader).  Descanse con frecuencia, reljese y tome sus vitaminas prenatales para evitar la fatiga, el estrs y la anemia.  Contine con los autocontroles de la mama.  Evite Product manager y fumar tabaco. Las sustancias qumicas de los cigarrillos que pasan a la leche materna y la exposicin al humo ambiental del tabaco pueden daar al beb.  No consuma alcohol ni drogas, incluida la marihuana. Algunos medicamentos, que pueden ser perjudiciales para el beb, pueden pasar a travs de la [employer]. Es importante que consulte a su mdico antes de Medical sales representative, incluidos todos los medicamentos recetados y de [place], as como los suplementos vitamnicos y herbales. Puede quedar embarazada durante la lactancia. Si desea controlar la natalidad, consulte a su mdico cules son las opciones ms seguras para el beb. SOLICITE ATENCIN MDICA SI:  Usted siente que quiere dejar de Museum/gallery exhibitions officer o se siente frustrada con la lactancia.  Siente dolor en las mamas o en los pezones.  Sus pezones estn agrietados o Water quality scientist.  Sus pechos estn irritados, sensibles o calientes.  Tiene un rea  hinchada en cualquiera de las mamas.  Siente escalofros o fiebre.  Tiene nuseas o vmitos.  Presenta una secrecin de otro lquido distinto de la leche materna de los pezones.  Sus mamas no se llenan antes de Museum/gallery exhibitions officer al beb para el quinto da despus del [place].  Se siente triste y deprimida.  El beb est demasiado somnoliento como para comer bien.  El beb tiene problemas para dormir.  Moja menos de 3 paales en 24 horas.  Defeca menos de 3 veces en 24 horas.  La piel del beb o la parte blanca de los ojos se vuelven amarillentas.  El beb no ha aumentado de [place] a los [address] de [place]. SOLICITE ATENCIN MDICA DE INMEDIATO SI:  El beb est muy cansado Retail buyer) y no se quiere despertar para comer.  Le sube la fiebre sin causa. Esta informacin no tiene Theme park manager el consejo del mdico. Asegrese de hacerle al mdico cualquier pregunta que tenga. Document Released: 01/23/2005 Document Revised: 05/17/2015 Document Reviewed: 07/17/2012 Elsevier Interactive Patient Education  2017 [employer].  Informacin sobre la prueba de [place] de  parto despus de Eustace Quailuna cesrea (Trial of Labor After Cesarean Delivery Information) La prueba de trabajo de parto despus de Neomia Dearuna cesrea (TOLAC por sus siglas en ingls) es cuando una mujer trata de dar a luz por va vaginal despus de una cesrea anterior. La TOLAC puede ser Neomia Dearuna opcin segura y [place] Islandsadecuada segn la historia clnica y otros factores de Beurys Lakeriesgo. Cuando TOLAC tiene xito y es capaz de tener un parto vaginal, esto se llama un parto vaginal despus de Neomia Dearuna cesrea (PVDC).  CANDIDATAS PARA TOLAC Este procedimiento es posible para algunas mujeres que:  Hayan sido sometidas a uno o dos partos por cesrea en los que la incisin del tero fue horizontal (transversal baja).  Estn esperando gemelos y tuvieron una incisin transversal baja durante una cesrea.  No tienen una cicatriz uterina vertical (clsica).  No han tenido una  ruptura en la pared del tero (ruptura uterina). El TOLAC tambin puede intentarse en mujeres que cumplen con los criterios apropiados:  Son menores de [address] aos.  Son altas y tienen un ndice de masa corporal [facility]) inferior a 30.  . Fredderick Phenixienen una cicatriz uterina desconocida.  Dan a luz en un centro equipado para realizar un parto por cesrea de emergencia. Este equipo debe ser capaz de Company secretarymanejar las posibles complicaciones, como una ruptura uterina.  Tienen asesoramiento completo United Stationerssobre los beneficios y riesgos del TOLAC.  Han comentado acerca de futuros planes de embarazo con su mdico.  Han planificado tener varios embarazos ms. CANDIDATAS A TENER MS XITO EN TOLAC:  Han tenido un parto vaginal exitoso antes o despus de su parto por cesrea.  Experimentan un trabajo de parto que comienza, naturalmente, en o antes de la fecha estimada (40 semanas de gestacin).  El beb no es muy grande ( macrosmico).   Han tenido un parto por cesrea anterior, pero actualmente no hay factores que promoveran un parto por cesrea (como una posicin de Clermontnalgas).  Han tenido un solo parto por cesrea anteriormente.  Tuvo un parto por cesrea antes de realizar el Pratttrabajo de parto y no despus de Teacher, English as a foreign languagela dilatacin completa. TOLAC puede ser ms apropiado para mujeres que cumplen con las normas anteriores y que planean tener ms embarazos. No se recomienda en partos domiciliarios. CANDIDATAS A TENER MENOS XITO EN TOLAC:  Tienen un parto inducido con un cuello uterino desfavorable. Un cuello uterino desfavorable es cuando no se dilata lo suficiente (entre otros factores).  Nunca han tenido un parto vaginal.  Han tenido ms de dos partos por cesrea.  Tienen un embarazo de ms de 40 semanas de gestacin.  Est embarazada de un un beb con sospecha de un peso mayor de 4.000 gramos (8  libras) y no tiene antecedentes de un parto vaginal.  Han tenido embarazos muy cercanos. BENEFICIOS SUGERIDOS DE LA  TOLAC  Tiempo de recuperacin ms rpido.  Permanencia ms breve en el hospital.  Menos dolor y problemas que en un parto por cesrea. Las AK Steel Holding Corporationmujeres que tienen un parto por cesrea tienen una mayor probabilidad de necesitar sangre o tener fiebre, una infeccin o un cogulo sanguneo en las piernas. RIESGOS SUGERIDOS DE LA TOLAC El riesgo ms alto de complicaciones ocurre en mujeres que intentan un TOLAC y fracasan. Una TOLAC que fracasa resulta en una cesrea no planificada. Los riesgos relacionados con TOLAC o cesreas repetidas son:   Lulu Ridingrdida de sangre.  Infeccin.  Cogulos sanguneos.  Lesiones en los rganos o tejidos circundantes.  Menor riesgo de remocin del tero (histerectoma).  Posibles  problemas con la placenta (como placenta previa o placenta acreta) en embarazos futuros. Aunque es muy raro, las preocupaciones principales con el TOLAC son:  Ruptura de la cicatriz uterina de una cesrea anterior.  Necesidad de una cesrea de Associate Professoremergencia.  Tener un mal resultado para el beb (morbilidad perinatal). Irven ShellingPARA OBTENER MS INFORMACIN Celanese Corporationmerican College of Obstetricians and Gynecologists (Colegio Estadounidense de Obstetras y Gineclogos): www.acog.org Celanese Corporationmerican College of Nurse-Midwives (Colegio Estadounidense de Enfermeras - parteras): www.midwife.org Esta informacin no tiene Theme park managercomo fin reemplazar el consejo del mdico. Asegrese de hacerle al mdico cualquier pregunta que tenga. Document Released: 01/12/2011 Document Revised: 11/13/2012 Elsevier Interactive Patient Education  2017 [employer]vin[employer]vier Inc.

## 2016-02-09 ENCOUNTER — Encounter (HOSPITAL_COMMUNITY): Payer: Self-pay | Admitting: *Deleted

## 2016-02-11 ENCOUNTER — Ambulatory Visit (INDEPENDENT_AMBULATORY_CARE_PROVIDER_SITE_OTHER): Payer: Self-pay | Admitting: Internal Medicine

## 2016-02-11 DIAGNOSIS — Z3483 Encounter for supervision of other normal pregnancy, third trimester: Secondary | ICD-10-CM

## 2016-02-11 NOTE — Patient Instructions (Signed)
  Fue tan agradable verte! El beb se ve genial hoy. Llamar al Dr. Shawnie PonsPratt para ver si puedo reprogramar la fecha de su cesrea.  Nos vemos en 2 semanas!

## 2016-02-11 NOTE — Progress Notes (Signed)
Autumn Johnson is a 26 y.o. [id number] at 145w0d here for routine follow up.  She reports fetal movement. No vaginal bleeding, no leakage of fluids, no contractions. See flow sheet for details.  A/P: Pregnancy at 5445w0d.  Doing well.   Pregnancy issues include measuring size greater than dates, but has had US that was consistent with dates. May be secondary to patient's body habitus.  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 She states she tried to call Monday to schedule an appointment, but couldn't get through. She will try to call again today.  Pt had an appointment with Dr. Shawnie PonsPratt on 12/29. Elective repeat C/S scheduled for 03/24/2016, as Pt has a history of uterine dehiscence in the past. Pt states that her husband has a very important court date scheduled on that same day and is requesting that we change her C/S date. I will send a message to Dr. Shawnie PonsPratt to see if we can reschedule this.  Preterm labor and fetal movement precautions reviewed. Safe sleep discussed. Follow up 2 weeks.  Willadean CarolKaty Layson Bertsch, MD

## 2016-02-24 ENCOUNTER — Encounter: Payer: Self-pay | Admitting: Internal Medicine

## 2016-03-03 ENCOUNTER — Other Ambulatory Visit (HOSPITAL_COMMUNITY): Admission: RE | Admit: 2016-03-03 | Payer: Self-pay | Source: Ambulatory Visit | Admitting: Family Medicine

## 2016-03-03 ENCOUNTER — Ambulatory Visit (INDEPENDENT_AMBULATORY_CARE_PROVIDER_SITE_OTHER): Payer: Self-pay | Admitting: Internal Medicine

## 2016-03-03 VITALS — BP 99/60 | HR 96 | Temp 98.2°F | Wt 217.0 lb

## 2016-03-03 DIAGNOSIS — Z3483 Encounter for supervision of other normal pregnancy, third trimester: Secondary | ICD-10-CM

## 2016-03-03 NOTE — Progress Notes (Signed)
Autumn Johnson is a 26 y.o. [id number] at 17w0dhere for routine follow up.  She reports good fetal movement. No vaginal bleeding, no leakage of fluids. She endorses occasional mild contractions.  See flow sheet for details.  A/P: Pregnancy at 360w0d Doing well.   Pregnancy issues include: 1. Obesity- weight has been stable since 10/2015 2. Hx C/S x 2 (had asymptomatic uterine dehiscence during second C/S)- has met with Dr. PrKennon Roundsnd repeat C/S has been scheduled. 3. Some concern for size > dates during 2nd trimester, but growth USKoreaas normal.  Infant feeding choice: breast Contraception choice: Depo Infant circumcision desired: not applicable  Tdap was not given today. GBS and gc/chlamydia testing was performed today.  Preterm labor and fetal movement precautions reviewed. Safe sleep discussed. Pt is requesting that we change her C/S date if possible. She is currently scheduled for 2/16 and her husband has a court date on that day. Will contact C/S scheduling to see if we can change the date. Follow up 1 week(s). [facility]

## 2016-03-03 NOTE — Patient Instructions (Addendum)
Fue tan agradable verte! Todo se ve bien hoy. Intentar cambiar su cita de cesrea.  Te veremos de regreso en 1 semana.  -Dr. Nancy Marus   Contracciones de Designer, multimedia (Braxton Hicks Contractions) Durante el [place], pueden presentarse contracciones uterinas que no siempre indican que est en [place]. QU SON LAS CONTRACCIONES DE BRAXTON HICKS? Las [employer] se presentan antes del [place] de [place] se conocen como contracciones de [place] o falso trabajo de [place]. Hacia el final del embarazo (32 a 34semanas), estas contracciones pueden aparecen con ms frecuencia y volverse ms intensas. No corresponden al Aleen Campi de parto verdadero porque estas contracciones no producen el agrandamiento (la dilatacin) y el afinamiento del cuello del tero. Algunas veces, es difcil distinguirlas del trabajo de parto verdadero porque en algunos casos pueden ser [employer], y las personas tienen diferentes niveles de tolerancia al [employer]. No debe sentirse avergonzada si concurre al hospital con falso trabajo de [place]. En ocasiones, la nica forma de saber si el trabajo de parto es verdadero es que el mdico determine si hay cambios en el cuello del tero. Si no hay problemas prenatales u otras complicaciones de salud asociadas con el embarazo, no habr inconvenientes si la envan a su casa con falso trabajo de parto y espera que comience el verdadero. CMO DIFERENCIAR EL TRABAJO DE PARTO FALSO DEL VERDADERO Falso trabajo de parto   Las contracciones del falso trabajo de parto duran menos y no son tan intensas como las verdaderas.  Generalmente son irregulares.  A menudo, se sienten en la parte delantera de la parte baja del abdomen y en la ingle,  y pueden desaparecer cuando camina o cambia de posicin mientras est acostada.  Las contracciones se vuelven ms dbiles y su duracin es Adult nurse a medida que el tiempo transcurre.  Por lo general, no se hacen progresivamente ms intensas,  regulares y Herbalist entre s como en el caso del [place] de parto verdadero. Theodis Blaze de parto   Las contracciones del verdadero trabajo de parto duran de 30 a 70segundos, son muy regulares y suelen volverse ms intensas, y [place] su frecuencia.  No desaparecen cuando camina.  La molestia generalmente se siente en la parte superior del tero y se extiende hacia la zona inferior del abdomen y Parker [employer]in cintura.  El mdico podr examinarla para determinar si el trabajo de parto es verdadero. El examen mostrar si el cuello del tero se est dilatando y [place]. LO QUE DEBE RECORDAR  [place] haciendo los ejercicios habituales y siga otras indicaciones que el mdico le d.  Tome todos los medicamentos como le indic el mdico.  Oceanographer a las visitas prenatales regulares.  Coma y beba con moderacin si cree que est en trabajo de parto.  Si las contracciones de [employer] provocan incomodidad:  Cambie de posicin: si est acostada o descansando, camine; si est caminando, descanse.  Sintese y descanse en una baera con agua tibia.  Beba 2 o 3vasos de [place]. La deshidratacin puede provocar contracciones.  Respire lenta y profundamente varias veces por hora. CUNDO DEBO BUSCAR ASISTENCIA MDICA INMEDIATA? Solicite atencin mdica de inmediato si:  Las contracciones se intensifican, se hacen ms regulares y Arboriculturist s.  Tiene una prdida de lquido por la vagina.  Tiene fiebre.  Elimina mucosidad manchada con [place].  Tiene una hemorragia vaginal abundante.  Tiene dolor abdominal permanente.  Tiene un dolor en la zona lumbar que nunca tuvo antes.  Siente que la cabeza del  beb empuja hacia abajo y ejerce presin en la zona plvica.  El beb no se mueve Dentisttanto como sola. Esta informacin no tiene Theme park managercomo fin reemplazar el consejo del mdico. Asegrese de hacerle al mdico cualquier pregunta que tenga. Document Released: 11/02/2004 Document Revised:  05/17/2015 Document Reviewed: 11/04/2012 Elsevier Interactive Patient Education  2017 Arvin[employer]Elsevier Inc.

## 2016-03-05 ENCOUNTER — Encounter: Payer: Self-pay | Admitting: Internal Medicine

## 2016-03-05 DIAGNOSIS — E669 Obesity, unspecified: Secondary | ICD-10-CM | POA: Insufficient documentation

## 2016-03-05 LAB — STREP B DNA PROBE: STREP GROUP B AG: NOT DETECTED

## 2016-03-07 LAB — CERVICOVAGINAL ANCILLARY ONLY
Bacterial vaginitis: NEGATIVE
Candida vaginitis: NEGATIVE
Chlamydia: NEGATIVE
Neisseria Gonorrhea: NEGATIVE
TRICH (WINDOWPATH): NEGATIVE

## 2016-03-10 ENCOUNTER — Ambulatory Visit (INDEPENDENT_AMBULATORY_CARE_PROVIDER_SITE_OTHER): Payer: Self-pay | Admitting: Internal Medicine

## 2016-03-10 ENCOUNTER — Telehealth (HOSPITAL_COMMUNITY): Payer: Self-pay | Admitting: *Deleted

## 2016-03-10 ENCOUNTER — Encounter (HOSPITAL_COMMUNITY): Payer: Self-pay

## 2016-03-10 DIAGNOSIS — Z3483 Encounter for supervision of other normal pregnancy, third trimester: Secondary | ICD-10-CM

## 2016-03-10 NOTE — Telephone Encounter (Signed)
Preadmission screen Interpreter number [phone]

## 2016-03-10 NOTE — Progress Notes (Signed)
Autumn Johnson is a 26 y.o. [id number] at 7058w0d here for routine follow up.  She reports mild irregular contractions, no leakage of fluids, no vaginal bleeding, good fetal movement. See flow sheet for details.  A/P: Pregnancy at 7658w0d. Doing well.   Pregnancy issues include: 1. Obesity- weight up 2 lbs since last visit. 2. Hx C/S x 2- repeat C/S scheduled for 2/16 3. Concern for size > dates during 2nd trimester, but growth US was normal. Likely 2/2 maternal body habitus.  Infant feeding choice: breast Contraception choice: depo Infant circumcision desired: not applicable  GBS and gc/chlamydia testing results were reviewed today.   Labor and fetal movement precautions reviewed. Follow up 1 week in OB clinic.

## 2016-03-10 NOTE — Patient Instructions (Signed)
Por favor, haga una cita en la clnica OB en 1 semana.   Evaluacin de los movimientos fetales  (Fetal Movement Counts) Nombre del paciente: __________________________________________________ Autumn Johnson estimada: ____________________ Autumn Johnson de los movimientos fetales es muy recomendable en los embarazos de alto riesgo, pero tambin es una buena idea que lo hagan todas las [place]. El Firefighter que comience a contarlos a las 28 semanas de [place]. Los movimientos fetales suelen aumentar:   Despus de Animator.  Despus de la actividad fsica.  Despus de comer o beber [employer] o fro.  En reposo. Preste atencin cuando sienta que el beb est ms activo. Esto le ayudar a notar un patrn de ciclos de vigilia y sueo de su beb y cules son los factores que contribuyen a un aumento de los movimientos fetales. Es importante llevar a cabo un recuento de movimientos fetales, al mismo tiempo cada da, cuando el beb normalmente est ms activo.  CMO CONTAR LOS MOVIMIENTOS FETALES 1. Busque un lugar tranquilo y cmodo para sentarse o recostarse sobre el lado izquierdo. Al recostarse sobre su lado izquierdo, le proporciona una mejor circulacin de [place] y oxgeno al beb. 2. Anote el da y la hora en una hoja de papel o en un diario. 3. Comience contando las pataditas, revoloteos, chasquidos, vueltas o pinchazos en un perodo de 2 horas. Debe sentir al menos 10 movimientos en 2 horas. 4. Si no siente 10 movimientos en 2 horas, espere 2  3 horas y cuente de nuevo. Busque cambios en el patrn o si no cuenta lo suficiente en 2 horas. SOLICITE ATENCIN MDICA SI:   Siente menos de 10 pataditas en 2 horas, en dos intentos.  No hay movimientos durante una hora.  El patrn se modifica o le lleva ms tiempo Art gallery manager las 10 pataditas.  Siente que el beb no se mueve como lo hace habitualmente. Fecha: ____________ Movimientos: ____________ Autumn Johnson inicio:  ____________ Autumn Johnson finalizacin: ____________  Autumn Johnson: ____________ Movimientos: ____________ Autumn Johnson inicio: ____________ Autumn Johnson finalizacin: ____________  Autumn Johnson: ____________ Movimientos: ____________ Autumn Johnson inicio: ____________ Autumn Johnson finalizacin: ____________  Autumn Johnson: ____________ Movimientos: ____________ Autumn Johnson inicio: ____________ Autumn Johnson finalizacin: ____________  Autumn Johnson: ____________ Movimientos: ____________ Autumn Johnson inicio: ____________ Autumn Johnson de finalizacin: ____________  Autumn Johnson: ____________ Movimientos: ____________ Autumn Johnson de inicio: ____________ Autumn Johnson de finalizacin: ____________  Autumn Johnson: ____________ Movimientos: ____________ Autumn Johnson de inicio: ____________ Autumn Johnson de finalizacin: ____________  Autumn Johnson: ____________ Movimientos: ____________ Autumn Johnson de inicio: ____________ Autumn Johnson de finalizacin: ____________  Autumn Johnson: ____________ Movimientos: ____________ Autumn Johnson de inicio: ____________ Autumn Johnson de finalizacin: ____________  Autumn Johnson: ____________ Movimientos: ____________ Autumn Johnson de inicio: ____________ Autumn Johnson de finalizacin: ____________  Autumn Johnson: ____________ Movimientos: ____________ Autumn Johnson de inicio: ____________ Autumn Johnson de finalizacin: ____________  Autumn Johnson: ____________ Movimientos: ____________ Autumn Johnson de inicio: ____________ Autumn Johnson de finalizacin: ____________  Autumn Johnson: ____________ Movimientos: ____________ Autumn Johnson de inicio: ____________ Autumn Johnson de finalizacin: ____________  Autumn Johnson: ____________ Movimientos: ____________ Autumn Johnson de inicio: ____________ Autumn Johnson de finalizacin: ____________  Autumn Johnson: ____________ Movimientos: ____________ Autumn Johnson de inicio: ____________ Autumn Johnson de finalizacin: ____________  Autumn Johnson: ____________ Movimientos: ____________ Autumn Johnson de inicio: ____________ Autumn Johnson de finalizacin: ____________  Autumn Johnson: ____________ Movimientos: ____________ Autumn Johnson de inicio: ____________ Autumn Johnson de finalizacin: ____________  Autumn Johnson: ____________ Movimientos: ____________ Autumn Johnson de inicio: ____________ Autumn Johnson de finalizacin:  ____________  Autumn Johnson: ____________ Movimientos: ____________ Autumn Johnson inicio: ____________ Autumn Johnson finalizacin: ____________  Autumn Johnson: ____________ Movimientos: ____________ Autumn Johnson inicio: ____________ Autumn Johnson finalizacin: ____________  Autumn Johnson: ____________ Movimientos: ____________ Autumn Johnson inicio: ____________ Autumn Johnson  de finalizacin: ____________  Autumn NonesFecha: ____________ Movimientos: ____________ Autumn BornHora de inicio: ____________ Autumn BornHora de finalizacin: ____________  Autumn NonesFecha: ____________ Movimientos: ____________ Autumn BornHora de inicio: ____________ Autumn BornHora de finalizacin: ____________  Autumn NonesFecha: ____________ Movimientos: ____________ Autumn BornHora de inicio: ____________ Autumn BornHora de finalizacin: ____________  Autumn NonesFecha: ____________ Movimientos: ____________ Autumn BornHora de inicio: ____________ Autumn BornHora de finalizacin: ____________  Autumn NonesFecha: ____________ Movimientos: ____________ Autumn BornHora de inicio: ____________ Autumn RussianHora de finalizacin: ____________  Autumn NonesFecha: ____________ Movimientos: ____________ Autumn RussianHora de inicio: ____________ Autumn RussianHora de finalizacin: ____________  Autumn NonesFecha: ____________ Movimientos: ____________ Autumn RussianHora de inicio: ____________ Autumn RussianHora de finalizacin: ____________  Autumn NonesFecha: ____________ Movimientos: ____________ Autumn RussianHora de inicio: ____________ Autumn RussianHora de finalizacin: ____________  Autumn NonesFecha: ____________ Movimientos: ____________ Autumn RussianHora de inicio: ____________ Autumn RussianHora de finalizacin: ____________  Autumn NonesFecha: ____________ Movimientos: ____________ Autumn RussianHora de inicio: ____________ Autumn RussianHora de finalizacin: ____________  Autumn NonesFecha: ____________ Movimientos: ____________ Autumn RussianHora de inicio: ____________ Autumn RussianHora de finalizacin: ____________  Autumn NonesFecha: ____________ Movimientos: ____________ Autumn RussianHora de inicio: ____________ Autumn RussianHora de finalizacin: ____________  Autumn NonesFecha: ____________ Movimientos: ____________ Autumn RussianHora de inicio: ____________ Autumn RussianHora de finalizacin: ____________  Autumn NonesFecha: ____________ Movimientos: ____________ Autumn RussianHora de inicio: ____________ Autumn RussianHora de finalizacin: ____________  Autumn NonesFecha: ____________  Movimientos: ____________ Autumn RussianHora de inicio: ____________ Autumn RussianHora de finalizacin: ____________  Autumn NonesFecha: ____________ Movimientos: ____________ Autumn RussianHora de inicio: ____________ Autumn RussianHora de finalizacin: ____________  Autumn NonesFecha: ____________ Movimientos: ____________ Autumn RussianHora de inicio: ____________ Autumn RussianHora de finalizacin: ____________  Autumn NonesFecha: ____________ Movimientos: ____________ Autumn RussianHora de inicio: ____________ Autumn RussianHora de finalizacin: ____________  Autumn NonesFecha: ____________ Movimientos: ____________ Autumn RussianHora de inicio: ____________ Autumn RussianHora de finalizacin: ____________  Autumn NonesFecha: ____________ Movimientos: ____________ Autumn RussianHora de inicio: ____________ Autumn RussianHora de finalizacin: ____________  Autumn NonesFecha: ____________ Movimientos: ____________ Autumn RussianHora de inicio: ____________ Autumn RussianHora de finalizacin: ____________  Autumn NonesFecha: ____________ Movimientos: ____________ Autumn RussianHora de inicio: ____________ Autumn RussianHora de finalizacin: ____________  Autumn NonesFecha: ____________ Movimientos: ____________ Autumn RussianHora de inicio: ____________ Autumn RussianHora de finalizacin: ____________  Autumn NonesFecha: ____________ Movimientos: ____________ Autumn RussianHora de inicio: ____________ Autumn RussianHora de finalizacin: ____________  Autumn NonesFecha: ____________ Movimientos: ____________ Autumn RussianHora de inicio: ____________ Autumn RussianHora de finalizacin: ____________  Autumn NonesFecha: ____________ Movimientos: ____________ Autumn RussianHora de inicio: ____________ Autumn RussianHora de finalizacin: ____________  Autumn NonesFecha: ____________ Movimientos: ____________ Autumn RussianHora de inicio: ____________ Autumn RussianHora de finalizacin: ____________  Autumn NonesFecha: ____________ Movimientos: ____________ Autumn RussianHora de inicio: ____________ Autumn RussianHora de finalizacin: ____________  Autumn NonesFecha: ____________ Movimientos: ____________ Autumn RussianHora de inicio: ____________ Autumn RussianHora de finalizacin: ____________  Autumn NonesFecha: ____________ Movimientos: ____________ Autumn RussianHora de inicio: ____________ Autumn RussianHora de finalizacin: ____________  Autumn NonesFecha: ____________ Movimientos: ____________ Autumn RussianHora de inicio: ____________ Autumn RussianHora de finalizacin: ____________  Autumn NonesFecha: ____________ Movimientos: ____________ Autumn RussianHora de inicio:  ____________ Autumn RussianHora de finalizacin: ____________  Autumn NonesFecha: ____________ Movimientos: ____________ Autumn RussianHora de inicio: ____________ Autumn RussianHora de finalizacin: ____________  Autumn NonesFecha: ____________ Movimientos: ____________ Autumn RussianHora de inicio: ____________ Autumn RussianHora de finalizacin: ____________  Autumn NonesFecha: ____________ Movimientos: ____________ Autumn RussianHora de inicio: ____________ Autumn RussianHora de finalizacin: ____________  Esta informacin no tiene como fin reemplazar el consejo del mdico. Asegrese de hacerle al mdico cualquier pregunta que tenga. Document Released: 05/02/2007 Document Revised: 01/10/2012 Elsevier Interactive Patient Education  2017 [employer].

## 2016-03-14 NOTE — H&P (Signed)
Autumn Johnson is an 26 y.o. [phone] 4738w4d female.   Chief Complaint: Previous C-section x 2 HPI: Needs RLTCS at 39 wks  Past Medical History:  Diagnosis Date  . Medical history non-contributory     Past Surgical History:  Procedure Laterality Date  . CESAREAN SECTION      Family History  Problem Relation Age of Onset  . Diabetes Mother     Pre-diabetes  . Hypertension Mother   . Hyperlipidemia Sister    Social History:  reports that she has never smoked. She has never used smokeless tobacco. She reports that she does not drink alcohol or use drugs.   No Known Allergies  No current facility-administered medications on file prior to encounter.    Current Outpatient Prescriptions on File Prior to Encounter  Medication Sig Dispense Refill  . Prenatal Multivit-Min-Fe-FA (PRENATAL VITAMINS) 0.8 MG tablet Take 1 tablet by mouth daily. 30 tablet 12    A comprehensive review of systems was negative.  Last menstrual period 05/12/2015. LMP 05/12/2015 (Approximate)  General appearance: alert, cooperative and appears stated age Neck: no adenopathy, supple, symmetrical, trachea midline and thyroid not enlarged, symmetric, no tenderness/mass/nodules Lungs: normal effort Heart: regular rate and rhythm Abdomen: gavid, NT Extremities: Homans sign is negative, no sign of DVT Skin: Skin color, texture, turgor normal. No rashes or lesions Neurologic: Grossly normal   Lab Results  Component Value Date   WBC 7.2 01/05/2016   HGB 11.3 (L) 01/05/2016   HCT 34.1 (L) 01/05/2016   MCV 94.5 01/05/2016   PLT 199 01/05/2016         ABO, Rh: O/POS/-- (09/15 1049)  Antibody: NEG (09/15 1049)  Rubella: !Error!  RPR: NON REAC (11/29 0853)  HBsAg: NEGATIVE (09/15 1049)  HIV: NONREACTIVE (11/29 0853)  GBS: NOT DETECTED (01/26 1652)     Assessment/Plan Principal Problem:   Previous cesarean delivery affecting pregnancy, antepartum  For 3rd ELTCS. Declines BTL. Risks include but  are not limited to bleeding, infection, injury to surrounding structures, including bowel, bladder and ureters, blood clots, and death.  Likelihood of success is high.   Reva Boresanya S Dietrick Barris 03/14/2016, 11:16 AM

## 2016-03-17 ENCOUNTER — Ambulatory Visit (INDEPENDENT_AMBULATORY_CARE_PROVIDER_SITE_OTHER): Payer: Self-pay | Admitting: Internal Medicine

## 2016-03-17 DIAGNOSIS — Z3483 Encounter for supervision of other normal pregnancy, third trimester: Secondary | ICD-10-CM

## 2016-03-17 NOTE — Progress Notes (Signed)
Autumn Johnson is a 26 y.o. [id number] at 2220w0d here for routine follow up.  She reports no vaginal bleeding, no leakage of fluids. Occasional irregular contractions. Endorses fetal movement. She endorses itchy throat. Her daughter has been sick recently with cough and fever. Her daughter tested negative for the flu. See flow sheet for details.  A/P: Pregnancy at 6420w0d. Doing well.   Pregnancy issues include: 1. Obesity- up 1 lb since last visit; Pt has gained 9 lb throughout pregnancy. 2. Concern for size > dates during 2nd trimester, but growth US normal.  Infant feeding choice: breast Contraception choice: depo Infant circumcision desired: not applicable  GBS and gc/chlamydia testing results were reviewed today.   Labor and fetal movement precautions reviewed. Follow up 1 week.

## 2016-03-17 NOTE — Patient Instructions (Signed)
  Fue tan agradable verte! El beb se ve genial hoy! Te veremos de nuevo una vez ms la prxima semana y luego tu beb nacer!

## 2016-03-20 ENCOUNTER — Ambulatory Visit (INDEPENDENT_AMBULATORY_CARE_PROVIDER_SITE_OTHER): Payer: Self-pay | Admitting: Internal Medicine

## 2016-03-20 VITALS — BP 100/60 | HR 90 | Temp 98.4°F | Wt 221.0 lb

## 2016-03-20 DIAGNOSIS — Z3483 Encounter for supervision of other normal pregnancy, third trimester: Secondary | ICD-10-CM

## 2016-03-20 NOTE — Patient Instructions (Signed)
  Fue tan agradable verte! Le deseo la mejor de las suertes durante su cesrea. Si nota contracciones fuertes y regulares que ocurren cada 3-5 minutos, un gran chorro de lquidos como la ruptura de su agua, o si tiene sangrado vaginal, vaya al [facility] inmediatamente.

## 2016-03-20 NOTE — Progress Notes (Signed)
Autumn Johnson is a 26 y.o. [id number] at 258w3d here for routine follow up.  She reports pelvic pressure and irregular contractions. No vaginal bleeding, no leakage of fluids. She endorses good fetal movement. See flow sheet for details.  A/P: Pregnancy at 3458w3d. Doing well.   Pregnancy issues include: 1. Obesity- weight stable, has gained 9lbs this pregnancy 2. Concern for size > dates during 2nd trimester. Growth US was normal with EFW 41.9 percentile.  Infant feeding choice: breast Contraception choice: depo Infant circumcision desired: not applicable  GBS and gc/chlamydia testing results were reviewed today.   Labor and fetal movement precautions reviewed. Pt scheduled for C/S on 03/24/16.

## 2016-03-21 ENCOUNTER — Encounter (HOSPITAL_COMMUNITY): Payer: Self-pay | Admitting: Certified Registered Nurse Anesthetist

## 2016-03-21 ENCOUNTER — Encounter (HOSPITAL_COMMUNITY)
Admission: AD | Disposition: A | Discharge status: Home / Self Care | Payer: Self-pay | Source: Ambulatory Visit | Attending: Obstetrics and Gynecology

## 2016-03-21 ENCOUNTER — Inpatient Hospital Stay (HOSPITAL_COMMUNITY): Payer: Medicaid Other | Admitting: Anesthesiology

## 2016-03-21 ENCOUNTER — Inpatient Hospital Stay (HOSPITAL_COMMUNITY)
Admission: AD | Admit: 2016-03-21 | Discharge: 2016-03-23 | DRG: 765 | Disposition: A | Discharge status: Home / Self Care | Payer: Medicaid Other | Source: Ambulatory Visit | Attending: Obstetrics and Gynecology | Admitting: Obstetrics and Gynecology

## 2016-03-21 ENCOUNTER — Encounter (HOSPITAL_COMMUNITY): Payer: Self-pay | Admitting: *Deleted

## 2016-03-21 DIAGNOSIS — Z3A38 38 weeks gestation of pregnancy: Secondary | ICD-10-CM

## 2016-03-21 DIAGNOSIS — Z6841 Body Mass Index (BMI) 40.0 and over, adult: Secondary | ICD-10-CM

## 2016-03-21 DIAGNOSIS — Z8249 Family history of ischemic heart disease and other diseases of the circulatory system: Secondary | ICD-10-CM

## 2016-03-21 DIAGNOSIS — O99214 Obesity complicating childbirth: Secondary | ICD-10-CM | POA: Diagnosis present

## 2016-03-21 DIAGNOSIS — O34211 Maternal care for low transverse scar from previous cesarean delivery: Principal | ICD-10-CM | POA: Diagnosis present

## 2016-03-21 DIAGNOSIS — Z98891 History of uterine scar from previous surgery: Secondary | ICD-10-CM

## 2016-03-21 DIAGNOSIS — Z833 Family history of diabetes mellitus: Secondary | ICD-10-CM | POA: Diagnosis not present

## 2016-03-21 HISTORY — DX: Obesity, unspecified: E66.9

## 2016-03-21 LAB — CBC
HEMATOCRIT: 34.9 % — AB (ref 36.0–46.0)
Hemoglobin: 12.2 g/dL (ref 12.0–15.0)
MCH: 31 pg (ref 26.0–34.0)
MCHC: 35 g/dL (ref 30.0–36.0)
MCV: 88.6 fL (ref 78.0–100.0)
PLATELETS: 182 10*3/uL (ref 150–400)
RBC: 3.94 MIL/uL (ref 3.87–5.11)
RDW: 15 % (ref 11.5–15.5)
WBC: 8 10*3/uL (ref 4.0–10.5)

## 2016-03-21 LAB — PREPARE RBC (CROSSMATCH)

## 2016-03-21 SURGERY — Surgical Case
Anesthesia: Spinal

## 2016-03-21 MED ORDER — BUPIVACAINE IN DEXTROSE 0.75-8.25 % IT SOLN
INTRATHECAL | Status: DC | PRN
  Administered 2016-03-21: 1.8 mg via INTRATHECAL

## 2016-03-21 MED ORDER — ONDANSETRON HCL 4 MG/2ML IJ SOLN: 4.0000 mg | Freq: Three times a day (TID) | INTRAMUSCULAR | Status: DC | PRN

## 2016-03-21 MED ORDER — OXYTOCIN 40 UNITS IN LACTATED RINGERS INFUSION - SIMPLE MED: 2.5000 [IU]/h | INTRAVENOUS | Status: AC

## 2016-03-21 MED ORDER — MORPHINE SULFATE (PF) 0.5 MG/ML IJ SOLN
INTRAMUSCULAR | Status: AC
  Filled 2016-03-21: qty 10

## 2016-03-21 MED ORDER — SODIUM CHLORIDE 0.9% FLUSH: 3.0000 mL | INTRAVENOUS | Status: DC | PRN

## 2016-03-21 MED ORDER — NALBUPHINE HCL 10 MG/ML IJ SOLN: 5.0000 mg | Freq: Once | INTRAMUSCULAR | Status: AC | PRN

## 2016-03-21 MED ORDER — CEFAZOLIN SODIUM-DEXTROSE 2-4 GM/100ML-% IV SOLN
2.0000 g | INTRAVENOUS | Status: AC
  Administered 2016-03-21: 2 g via INTRAVENOUS
  Filled 2016-03-21: qty 100

## 2016-03-21 MED ORDER — OXYTOCIN 10 UNIT/ML IJ SOLN
INTRAMUSCULAR | Status: AC
  Filled 2016-03-21: qty 4

## 2016-03-21 MED ORDER — SIMETHICONE 80 MG PO CHEW
80.0000 mg | CHEWABLE_TABLET | Freq: Three times a day (TID) | ORAL | Status: DC
  Administered 2016-03-21 – 2016-03-23 (×7): 80 mg via ORAL
  Filled 2016-03-21 (×8): qty 1

## 2016-03-21 MED ORDER — MEPERIDINE HCL 25 MG/ML IJ SOLN: 6.2500 mg | INTRAMUSCULAR | Status: DC | PRN

## 2016-03-21 MED ORDER — NALBUPHINE HCL 10 MG/ML IJ SOLN: 5.0000 mg | INTRAMUSCULAR | Status: DC | PRN

## 2016-03-21 MED ORDER — TETANUS-DIPHTH-ACELL PERTUSSIS 5-2.5-18.5 LF-MCG/0.5 IM SUSP: 0.5000 mL | Freq: Once | INTRAMUSCULAR | Status: DC

## 2016-03-21 MED ORDER — ONDANSETRON HCL 4 MG/2ML IJ SOLN
INTRAMUSCULAR | Status: DC | PRN
  Administered 2016-03-21: 4 mg via INTRAVENOUS

## 2016-03-21 MED ORDER — KETOROLAC TROMETHAMINE 30 MG/ML IJ SOLN
INTRAMUSCULAR | Status: AC
  Filled 2016-03-21: qty 1

## 2016-03-21 MED ORDER — PHENYLEPHRINE 8 MG IN D5W 100 ML (0.08MG/ML) PREMIX OPTIME
INJECTION | INTRAVENOUS | Status: AC
  Filled 2016-03-21: qty 100

## 2016-03-21 MED ORDER — FAMOTIDINE IN NACL 20-0.9 MG/50ML-% IV SOLN
20.0000 mg | Freq: Once | INTRAVENOUS | Status: AC
  Administered 2016-03-21: 20 mg via INTRAVENOUS
  Filled 2016-03-21: qty 50

## 2016-03-21 MED ORDER — OXYCODONE-ACETAMINOPHEN 5-325 MG PO TABS: 2.0000 | ORAL_TABLET | ORAL | Status: DC | PRN

## 2016-03-21 MED ORDER — OXYCODONE-ACETAMINOPHEN 5-325 MG PO TABS
1.0000 | ORAL_TABLET | ORAL | Status: DC | PRN
  Administered 2016-03-22 – 2016-03-23 (×5): 1 via ORAL
  Filled 2016-03-21 (×5): qty 1

## 2016-03-21 MED ORDER — DIPHENHYDRAMINE HCL 50 MG/ML IJ SOLN: 12.5000 mg | INTRAMUSCULAR | Status: DC | PRN

## 2016-03-21 MED ORDER — LACTATED RINGERS IV SOLN
INTRAVENOUS | Status: DC | PRN
  Administered 2016-03-21 (×3): via INTRAVENOUS

## 2016-03-21 MED ORDER — PRENATAL MULTIVITAMIN CH
1.0000 | ORAL_TABLET | Freq: Every day | ORAL | Status: DC
  Administered 2016-03-21 – 2016-03-23 (×4): 1 via ORAL
  Filled 2016-03-21 (×4): qty 1

## 2016-03-21 MED ORDER — KETOROLAC TROMETHAMINE 30 MG/ML IJ SOLN
30.0000 mg | Freq: Four times a day (QID) | INTRAMUSCULAR | Status: DC | PRN
  Administered 2016-03-21: 30 mg via INTRAMUSCULAR

## 2016-03-21 MED ORDER — PROMETHAZINE HCL 25 MG/ML IJ SOLN: 6.2500 mg | INTRAMUSCULAR | Status: DC | PRN

## 2016-03-21 MED ORDER — KETOROLAC TROMETHAMINE 30 MG/ML IJ SOLN: 30.0000 mg | Freq: Four times a day (QID) | INTRAMUSCULAR | Status: DC | PRN

## 2016-03-21 MED ORDER — MENTHOL 3 MG MT LOZG: 1.0000 | LOZENGE | OROMUCOSAL | Status: DC | PRN

## 2016-03-21 MED ORDER — OXYTOCIN 10 UNIT/ML IJ SOLN
INTRAMUSCULAR | Status: DC | PRN
  Administered 2016-03-21: 40 [IU] via INTRAVENOUS

## 2016-03-21 MED ORDER — ZOLPIDEM TARTRATE 5 MG PO TABS: 5.0000 mg | ORAL_TABLET | Freq: Every evening | ORAL | Status: DC | PRN

## 2016-03-21 MED ORDER — NALBUPHINE HCL 10 MG/ML IJ SOLN
5.0000 mg | Freq: Once | INTRAMUSCULAR | Status: AC | PRN
  Administered 2016-03-21: 5 mg via SUBCUTANEOUS

## 2016-03-21 MED ORDER — DEXTROSE 5 % IV SOLN
1.0000 ug/kg/h | INTRAVENOUS | Status: DC | PRN
  Filled 2016-03-21: qty 2

## 2016-03-21 MED ORDER — ACETAMINOPHEN 325 MG PO TABS
650.0000 mg | ORAL_TABLET | ORAL | Status: DC | PRN
  Administered 2016-03-21: 650 mg via ORAL
  Filled 2016-03-21: qty 2

## 2016-03-21 MED ORDER — NALOXONE HCL 0.4 MG/ML IJ SOLN: 0.4000 mg | INTRAMUSCULAR | Status: DC | PRN

## 2016-03-21 MED ORDER — SOD CITRATE-CITRIC ACID 500-334 MG/5ML PO SOLN
30.0000 mL | Freq: Once | ORAL | Status: AC
  Administered 2016-03-21: 30 mL via ORAL
  Filled 2016-03-21: qty 15

## 2016-03-21 MED ORDER — SCOPOLAMINE 1 MG/3DAYS TD PT72: 1.0000 | MEDICATED_PATCH | Freq: Once | TRANSDERMAL | Status: DC

## 2016-03-21 MED ORDER — SIMETHICONE 80 MG PO CHEW
80.0000 mg | CHEWABLE_TABLET | ORAL | Status: DC
  Administered 2016-03-21 – 2016-03-23 (×2): 80 mg via ORAL
  Filled 2016-03-21 (×2): qty 1

## 2016-03-21 MED ORDER — SIMETHICONE 80 MG PO CHEW: 80.0000 mg | CHEWABLE_TABLET | ORAL | Status: DC | PRN

## 2016-03-21 MED ORDER — WITCH HAZEL-GLYCERIN EX PADS: 1.0000 "application " | MEDICATED_PAD | CUTANEOUS | Status: DC | PRN

## 2016-03-21 MED ORDER — IBUPROFEN 600 MG PO TABS
600.0000 mg | ORAL_TABLET | Freq: Four times a day (QID) | ORAL | Status: DC
  Administered 2016-03-21 – 2016-03-23 (×8): 600 mg via ORAL
  Filled 2016-03-21 (×8): qty 1

## 2016-03-21 MED ORDER — ONDANSETRON HCL 4 MG/2ML IJ SOLN
INTRAMUSCULAR | Status: AC
  Filled 2016-03-21: qty 2

## 2016-03-21 MED ORDER — LACTATED RINGERS IV SOLN
INTRAVENOUS | Status: DC
  Administered 2016-03-21: 03:00:00 via INTRAVENOUS

## 2016-03-21 MED ORDER — DIPHENHYDRAMINE HCL 25 MG PO CAPS
25.0000 mg | ORAL_CAPSULE | ORAL | Status: DC | PRN
  Filled 2016-03-21: qty 1

## 2016-03-21 MED ORDER — SENNOSIDES-DOCUSATE SODIUM 8.6-50 MG PO TABS
2.0000 | ORAL_TABLET | ORAL | Status: DC
  Administered 2016-03-21 – 2016-03-23 (×2): 2 via ORAL
  Filled 2016-03-21 (×2): qty 2

## 2016-03-21 MED ORDER — LACTATED RINGERS IV SOLN
INTRAVENOUS | Status: DC | PRN
  Administered 2016-03-21: 06:00:00 via INTRAVENOUS

## 2016-03-21 MED ORDER — SCOPOLAMINE 1 MG/3DAYS TD PT72
MEDICATED_PATCH | TRANSDERMAL | Status: AC
  Filled 2016-03-21: qty 1

## 2016-03-21 MED ORDER — KETOROLAC TROMETHAMINE 30 MG/ML IJ SOLN
30.0000 mg | Freq: Once | INTRAMUSCULAR | Status: AC | PRN
  Administered 2016-03-21: 30 mg via INTRAVENOUS

## 2016-03-21 MED ORDER — PHENYLEPHRINE 8 MG IN D5W 100 ML (0.08MG/ML) PREMIX OPTIME
INJECTION | INTRAVENOUS | Status: DC | PRN
  Administered 2016-03-21: 60 ug/min via INTRAVENOUS

## 2016-03-21 MED ORDER — DIBUCAINE 1 % RE OINT: 1.0000 "application " | TOPICAL_OINTMENT | RECTAL | Status: DC | PRN

## 2016-03-21 MED ORDER — DIPHENHYDRAMINE HCL 25 MG PO CAPS
25.0000 mg | ORAL_CAPSULE | Freq: Four times a day (QID) | ORAL | Status: DC | PRN
  Administered 2016-03-21: 25 mg via ORAL
  Filled 2016-03-21: qty 1

## 2016-03-21 MED ORDER — NALBUPHINE HCL 10 MG/ML IJ SOLN
INTRAMUSCULAR | Status: AC
  Filled 2016-03-21: qty 1

## 2016-03-21 MED ORDER — FENTANYL CITRATE (PF) 100 MCG/2ML IJ SOLN
INTRAMUSCULAR | Status: AC
  Filled 2016-03-21: qty 2

## 2016-03-21 MED ORDER — HYDROMORPHONE HCL 1 MG/ML IJ SOLN: 0.2500 mg | INTRAMUSCULAR | Status: DC | PRN

## 2016-03-21 MED ORDER — SCOPOLAMINE 1 MG/3DAYS TD PT72
MEDICATED_PATCH | TRANSDERMAL | Status: DC | PRN
  Administered 2016-03-21: 1 via TRANSDERMAL

## 2016-03-21 MED ORDER — LACTATED RINGERS IV SOLN
INTRAVENOUS | Status: DC
  Administered 2016-03-21 (×2): via INTRAVENOUS

## 2016-03-21 MED ORDER — MORPHINE SULFATE-NACL 0.5-0.9 MG/ML-% IV SOSY
PREFILLED_SYRINGE | INTRAVENOUS | Status: DC | PRN
  Administered 2016-03-21: .2 mg via INTRATHECAL

## 2016-03-21 MED ORDER — NALBUPHINE HCL 10 MG/ML IJ SOLN
5.0000 mg | INTRAMUSCULAR | Status: DC | PRN
  Administered 2016-03-21: 5 mg via SUBCUTANEOUS

## 2016-03-21 MED ORDER — COCONUT OIL OIL: 1.0000 "application " | TOPICAL_OIL | Status: DC | PRN

## 2016-03-21 SURGICAL SUPPLY — 40 items
BENZOIN TINCTURE PRP APPL 2/3 (GAUZE/BANDAGES/DRESSINGS) ×3 IMPLANT
CHLORAPREP W/TINT 26ML (MISCELLANEOUS) ×3 IMPLANT
CLAMP CORD UMBIL (MISCELLANEOUS) IMPLANT
CLOSURE WOUND 1/2 X4 (GAUZE/BANDAGES/DRESSINGS) ×1
CLOTH BEACON ORANGE TIMEOUT ST (SAFETY) ×3 IMPLANT
DRAPE C SECTION CLR SCREEN (DRAPES) ×3 IMPLANT
DRSG OPSITE POSTOP 4X10 (GAUZE/BANDAGES/DRESSINGS) ×3 IMPLANT
ELECT REM PT RETURN 9FT ADLT (ELECTROSURGICAL) ×3
ELECTRODE REM PT RTRN 9FT ADLT (ELECTROSURGICAL) ×1 IMPLANT
EXTRACTOR VACUUM M CUP 4 TUBE (SUCTIONS) IMPLANT
EXTRACTOR VACUUM M CUP 4' TUBE (SUCTIONS)
GLOVE BIO SURGEON STRL SZ7.5 (GLOVE) ×3 IMPLANT
GLOVE BIOGEL PI IND STRL 7.0 (GLOVE) ×1 IMPLANT
GLOVE BIOGEL PI INDICATOR 7.0 (GLOVE) ×2
GOWN STRL REUS W/TWL 2XL LVL3 (GOWN DISPOSABLE) ×3 IMPLANT
GOWN STRL REUS W/TWL LRG LVL3 (GOWN DISPOSABLE) ×6 IMPLANT
KIT ABG SYR 3ML LUER SLIP (SYRINGE) IMPLANT
NEEDLE HYPO 22GX1.5 SAFETY (NEEDLE) ×3 IMPLANT
NEEDLE HYPO 25X5/8 SAFETYGLIDE (NEEDLE) IMPLANT
NS IRRIG 1000ML POUR BTL (IV SOLUTION) ×3 IMPLANT
PACK C SECTION WH (CUSTOM PROCEDURE TRAY) ×3 IMPLANT
PAD OB MATERNITY 4.3X12.25 (PERSONAL CARE ITEMS) ×3 IMPLANT
PENCIL SMOKE EVAC W/HOLSTER (ELECTROSURGICAL) ×3 IMPLANT
RTRCTR C-SECT PINK 25CM LRG (MISCELLANEOUS) ×3 IMPLANT
STRIP CLOSURE SKIN 1/2X4 (GAUZE/BANDAGES/DRESSINGS) ×2 IMPLANT
SUT CHROMIC 1 CTX 36 (SUTURE) ×6 IMPLANT
SUT VIC AB 1 CT1 27 (SUTURE) ×4
SUT VIC AB 1 CT1 27XBRD ANTBC (SUTURE) ×2 IMPLANT
SUT VIC AB 2-0 CT1 (SUTURE) ×3 IMPLANT
SUT VIC AB 2-0 CT1 27 (SUTURE) ×2
SUT VIC AB 2-0 CT1 TAPERPNT 27 (SUTURE) ×1 IMPLANT
SUT VIC AB 3-0 CT1 27 (SUTURE) ×4
SUT VIC AB 3-0 CT1 TAPERPNT 27 (SUTURE) ×2 IMPLANT
SUT VIC AB 3-0 SH 27 (SUTURE)
SUT VIC AB 3-0 SH 27X BRD (SUTURE) IMPLANT
SUT VIC AB 4-0 KS 27 (SUTURE) ×3 IMPLANT
SYR BULB IRRIGATION 50ML (SYRINGE) ×3 IMPLANT
SYR CONTROL 10ML LL (SYRINGE) ×3 IMPLANT
TOWEL OR 17X24 6PK STRL BLUE (TOWEL DISPOSABLE) ×3 IMPLANT
TRAY FOLEY CATH SILVER 14FR (SET/KITS/TRAYS/PACK) ×3 IMPLANT

## 2016-03-21 NOTE — Addendum Note (Signed)
Addendum  created 03/21/16 1518 by Elgie CongoNataliya H Dale Strausser, CRNA   Sign clinical note

## 2016-03-21 NOTE — MAU Note (Signed)
Urine sent to lab 

## 2016-03-21 NOTE — Anesthesia Postprocedure Evaluation (Signed)
Anesthesia Post Note  Patient: Autumn Johnson  Procedure(s) Performed: Procedure(s) (LRB): CESAREAN SECTION (N/A)  Patient location during evaluation: Mother Baby Anesthesia Type: Spinal Level of consciousness: awake and alert and oriented Pain management: pain level controlled Vital Signs Assessment: post-procedure vital signs reviewed and stable Respiratory status: nonlabored ventilation and spontaneous breathing Cardiovascular status: stable Postop Assessment: no headache, no backache, spinal receding, no signs of nausea or vomiting, patient able to bend at knees and adequate PO intake Anesthetic complications: no        Last Vitals:  Vitals:   03/21/16 1037 03/21/16 1331  BP: (!) 102/44 113/60  Pulse: 69 67  Resp: (!) 21 (!) 22  Temp: 36.9 C 36.4 C    Last Pain:  Vitals:   03/21/16 1331  TempSrc: Oral  PainSc:    Pain Goal:                 Land O'LakesMalinova,Baker Moronta Hristova

## 2016-03-21 NOTE — Anesthesia Postprocedure Evaluation (Signed)
Anesthesia Post Note  Patient: Autumn Johnson  Procedure(s) Performed: Procedure(s) (LRB): CESAREAN SECTION (N/A)  Patient location during evaluation: PACU Anesthesia Type: Spinal Level of consciousness: awake Pain management: pain level controlled Vital Signs Assessment: post-procedure vital signs reviewed and stable Respiratory status: spontaneous breathing Cardiovascular status: stable Postop Assessment: no headache, no backache, patient able to bend at knees, no signs of nausea or vomiting and spinal receding Anesthetic complications: no        Last Vitals:  Vitals:   03/21/16 0705 03/21/16 0815  BP:    Pulse:    Resp:    Temp: 37.1 C 36.9 C    Last Pain:  Vitals:   03/21/16 0815  TempSrc: Oral  PainSc:    Pain Goal:                 Valerya Maxton JR,JOHN Yisel Megill

## 2016-03-21 NOTE — Op Note (Signed)
Cesarean Section Procedure Note  03/21/2016  7:19 AM  PATIENT:  Autumn Johnson  26 y.o. female  PRE-OPERATIVE DIAGNOSIS:  Repeat cesarean section  POST-OPERATIVE DIAGNOSIS:  repeat cesarean section   PROCEDURE:  Repeat low transverse c section and lysis of adhesions  SURGEON:  Surgeon(s) and Role:    * Hermina StaggersMichael L Dnasia Gauna, MD - Primary  ASSISTANTS: none   ANESTHESIA:   spinal  EBL:  1000cc  BLOOD ADMINISTERED:none  DRAINS: none   LOCAL MEDICATIONS USED:  NONE  SPECIMEN:  Source of Specimen:  uterus  DISPOSITION OF SPECIMEN:  PATHOLOGY   Procedure Details   The patient was seen in the Holding Room. The risks, benefits, complications, treatment options, and expected outcomes were discussed with the patient.  The patient concurred with the proposed plan, giving informed consent.  The site of surgery properly noted/marked. The patient was taken to Operating Room # 9, identified as [name] and the procedure verified as C-Section Delivery. A Time Out was held and the above information confirmed.  After induction of anesthesia, the patient was draped and prepped in the usual sterile manner. A Pfannenstiel incision was made and carried down through the subcutaneous tissue to the fascia. During this portion of the procedure encountered a herniated segment of omental tissue. The fascia was dissected around this area and then the fascial incision was extended transversely. The fascia was separated from the underlying rectus tissue superiorly and inferiorly. The peritoneum was identified and entered. Alex self retaining retractor was placed. Peritoneal incision was extended longitudinally. The utero-vesical peritoneal reflection was incised transversely and the bladder flap was bluntly freed from the lower uterine segment. A low transverse uterine incision was made. Amniotic fluid was noted to meconium. Viable female infant was delivered without problems. Cord as cut after 1 minute  delay.Infant was passed off the NICU attendants. Apgars and weight pending.  The placenta was removed intact and appeared normal. Placenta was sent to pathology.The uterine outline, tubes and ovaries appeared normal. The uterine incision was closed with running locked sutures of Chromic. A second suture of the same was used to complete a two layer closure. Hemostasis was observed. Bladder flap was reapproximated with 3/0 Vicryl. Uterus was placed back in its normal anatomical position. Lavage was carried out until clear. Adhered areas of omentum were then dissected free. The small segment of omentum which had herniated through the have a nodule feeling to it. This was area was excised and sent to pathology. The peritoneum and abdominal muscle were reapproximated with 2/0 Vicryl. The fascia was then reapproximated with running sutures of Vicryl. The skin was reapproximated with Vicryl.  Instrument, sponge, and needle counts were correct prior the abdominal closure and at the conclusion of the case.   Complications:  None; patient tolerated the procedure well.  COUNTS:  YES  PLAN OF CARE: Admit to inpatient   PATIENT DISPOSITION:  PACU - hemodynamically stable.   Delay start of Pharmacological VTE agent (>24hrs) due to surgical blood loss or risk of bleeding: no             Disposition: PACU - hemodynamically stable.         Condition: stable   Hermina StaggersMichael L Zamari Vea, MD 03/21/2016 7:19 AM

## 2016-03-21 NOTE — Anesthesia Procedure Notes (Signed)
Spinal  Patient location during procedure: OR Staffing Anesthesiologist: Yancarlos Berthold Performed: anesthesiologist  Preanesthetic Checklist Completed: patient identified, site marked, surgical consent, pre-op evaluation, timeout performed, IV checked, risks and benefits discussed and monitors and equipment checked Spinal Block Patient position: sitting Prep: site prepped and draped and DuraPrep Patient monitoring: heart rate, continuous pulse ox and blood pressure Location: L3-4 Injection technique: single-shot Needle Needle type: Sprotte  Needle gauge: 24 G Needle length: 9 cm Additional Notes Expiration date of kit checked and confirmed. Patient tolerated procedure well, without complications.       

## 2016-03-21 NOTE — MAU Note (Signed)
PT  SAYS SHE THINKS  SROM-  7PM-   WHILE  EATING  SUPPER.      UC  HURT BAD   SINCE  8PM.    PNC  WITH  [facility].    LAST APPOINTMENT - VE  - CLOSED.

## 2016-03-21 NOTE — Anesthesia Preprocedure Evaluation (Signed)
Anesthesia Evaluation  Patient identified by MRN, date of birth, ID band Patient awake    Reviewed: Allergy & Precautions, NPO status , Patient's Chart, lab work & pertinent test results  Airway Mallampati: II  TM Distance: >3 FB Neck ROM: Full    Dental no notable dental hx.    Pulmonary neg pulmonary ROS,    Pulmonary exam normal breath sounds clear to auscultation       Cardiovascular negative cardio ROS Normal cardiovascular exam Rhythm:Regular Rate:Normal     Neuro/Psych negative neurological ROS  negative psych ROS   GI/Hepatic negative GI ROS, Neg liver ROS,   Endo/Other  Morbid obesity  Renal/GU negative Renal ROS     Musculoskeletal negative musculoskeletal ROS (+)   Abdominal   Peds  Hematology negative hematology ROS (+)   Anesthesia Other Findings   Reproductive/Obstetrics (+) Pregnancy                             Anesthesia Physical Anesthesia Plan  ASA: III  Anesthesia Plan: Spinal   Post-op Pain Management:    Induction:   Airway Management Planned:   Additional Equipment:   Intra-op Plan:   Post-operative Plan:   Informed Consent: I have reviewed the patients History and Physical, chart, labs and discussed the procedure including the risks, benefits and alternatives for the proposed anesthesia with the patient or authorized representative who has indicated his/her understanding and acceptance.   Dental advisory given  Plan Discussed with: CRNA  Anesthesia Plan Comments:         Anesthesia Quick Evaluation

## 2016-03-21 NOTE — Transfer of Care (Signed)
Immediate Anesthesia Transfer of Care Note  Patient: Autumn Johnson  Procedure(s) Performed: Procedure(s): CESAREAN SECTION (N/A)  Patient Location: PACU  Anesthesia Type:Spinal  Level of Consciousness: awake, alert , oriented and patient cooperative  Airway & Oxygen Therapy: Patient Spontanous Breathing  Post-op Assessment: Report given to RN and Post -op Vital signs reviewed and stable  Post vital signs: Reviewed and stable  Last Vitals:  Vitals:   03/21/16 0156 03/21/16 0317  BP: 119/74 124/84  Pulse: 83 82  Resp: 18 18  Temp: 36.6 C 36.3 C    Last Pain:  Vitals:   03/21/16 0346  TempSrc:   PainSc: 7          Complications: No apparent anesthesia complications

## 2016-03-21 NOTE — H&P (Signed)
Autumn Johnson is a 26 y.o. female [phone] @[redacted]w[redacted]d  with previous hx of LTCS x 2 presenting for labor evaluation reporting regular contractions worsening over last few hours and leakage of clear fluid at 7 pm.   Her LOF was clear and enough to wet her underwear but has not required a pad since onset. She reports good fetal movement, vaginal bleeding, vaginal itching/burning, urinary symptoms, h/a, dizziness, n/v, or fever/chills.     She last ate at 6 pm on 03/20/16 and had clear liquids at 10 pm.   OB History    Gravida Para Term Preterm AB Living   4 2 2   1 2    SAB TAB Ectopic Multiple Live Births   1       1     Past Medical History:  Diagnosis Date  . Medical history non-contributory   . Obesity    Past Surgical History:  Procedure Laterality Date  . CESAREAN SECTION     Family History: family history includes Diabetes in her mother; Hyperlipidemia in her sister; Hypertension in her mother. Social History:  reports that she has never smoked. She has never used smokeless tobacco. She reports that she does not drink alcohol or use drugs.     Maternal Diabetes: No Genetic Screening: Declined Maternal Ultrasounds/Referrals: Normal Fetal Ultrasounds or other Referrals:  None Maternal Substance Abuse:  No Significant Maternal Medications:  None Significant Maternal Lab Results:  Lab values include: Group B Strep negative Other Comments:  None  Review of Systems  Constitutional: Negative for chills, fever and malaise/fatigue.  Eyes: Negative for blurred vision.  Respiratory: Negative for cough and shortness of breath.   Cardiovascular: Negative for chest pain.  Gastrointestinal: Positive for abdominal pain. Negative for heartburn and vomiting.  Genitourinary: Negative for dysuria, frequency and urgency.  Musculoskeletal: Negative.   Neurological: Negative for dizziness and headaches.  Psychiatric/Behavioral: Negative for depression.   Maternal Medical History:  Reason for  admission: Contractions.   Contractions: Onset was 3-5 hours ago.   Frequency: regular.   Duration is approximately 1 minute.   Perceived severity is moderate.    Fetal activity: Perceived fetal activity is normal.   Last perceived fetal movement was within the past hour.    Prenatal complications: no prenatal complications Prenatal Complications - Diabetes: none.    Dilation: 4 Effacement (%): 90 Station: -3 Exam by:: Leftwich kirby CNM Blood pressure 119/74, pulse 83, temperature 97.9 F (36.6 C), temperature source Oral, resp. rate 18, height 4\' 11"  (1.499 m), weight 223 lb (101.2 kg), last menstrual period 05/12/2015. Maternal Exam:  Uterine Assessment: Contraction strength is mild.  Contraction duration is 70 seconds. Contraction frequency is regular.   Abdomen: Fetal presentation: vertex  Introitus: Ferning test: negative.   Cervix: Cervix evaluated by digital exam.     Fetal Exam Fetal Monitor Review: Mode: ultrasound.   Baseline rate: 135.  Variability: moderate (6-25 bpm).   Pattern: accelerations present and no decelerations.    Fetal State Assessment: Category I - tracings are normal.     Physical Exam  Nursing note and vitals reviewed. Constitutional: She is oriented to person, place, and time. She appears well-developed and well-nourished.  Neck: Normal range of motion.  Cardiovascular: Normal rate, regular rhythm and normal heart sounds.   Respiratory: Effort normal.  GI: Soft.  Musculoskeletal: Normal range of motion.  Neurological: She is alert and oriented to person, place, and time.  Skin: Skin is warm and dry.  Psychiatric: She has a  normal mood and affect. Her behavior is normal. Judgment and thought content normal.    Prenatal labs: ABO, Rh: O/POS/-- (09/15 1049) Antibody: NEG (09/15 1049) Rubella: 9.66 (09/15 1049) RPR: NON REAC (11/29 0853)  HBsAg: NEGATIVE (09/15 1049)  HIV: NONREACTIVE (11/29 0853)  GBS: NOT DETECTED (01/26 1652)    Assessment/Plan: 26 y.o. [id number] with prior LTCS x 2 Active labor at term GBS negative  Admit to OR for repeat C/S   Sharen Counter 03/21/2016, 2:37 AM  Pt seen and examined Prior c section x 2 now in active labor. Will proceed toward RLTCS. R/B/Post op care reviewed Pt verbalized understand and agrees to proceed.  Nursing and anesthesia aware. Interrupter services used.

## 2016-03-21 NOTE — Lactation Note (Signed)
This note was copied from a baby's chart. Lactation Consultation Note  Patient Name: Girl Henreitta CeaMara Cruz-Garcia NWGNF'AToday's Date: 03/21/2016 Reason for consult: Initial assessment  Baby 6 hours old. Mom speaks limited English, FOB translated. Assisted nursing student with NS fit and placement. Mom reports that she is comfortable right now, so enc student to assist mom with repositioning at next feeding. Assisted mom with hand expression with colostrum easily expressible. Enc nursing with cues. Mom reports that she nursed her last child for a week, but had plenty of milk.   Maternal Data Has patient been taught Hand Expression?: Yes Does the patient have breastfeeding experience prior to this delivery?: Yes  Feeding Feeding Type: Breast Fed Length of feed:  (LC assessed first 10 minutes of BF.)  LATCH Score/Interventions Latch: Grasps breast easily, tongue down, lips flanged, rhythmical sucking.  Audible Swallowing: None Intervention(s): Skin to skin;Hand expression  Type of Nipple: Everted at rest and after stimulation  Comfort (Breast/Nipple): Soft / non-tender     Hold (Positioning): Assistance needed to correctly position infant at breast and maintain latch. Intervention(s): Breastfeeding basics reviewed;Support Pillows  LATCH Score: 7  Lactation Tools Discussed/Used Tools: Nipple Shields Nipple shield size: 24   Consult Status Consult Status: Follow-up Date: 03/21/16 Follow-up type: In-patient    Sherlyn HayJennifer D Shanie Mauzy 03/21/2016, 12:13 PM

## 2016-03-22 DIAGNOSIS — R143 Flatulence: Secondary | ICD-10-CM

## 2016-03-22 LAB — RPR: RPR: NONREACTIVE

## 2016-03-22 LAB — CBC
HCT: 26.1 % — ABNORMAL LOW (ref 36.0–46.0)
Hemoglobin: 9.1 g/dL — ABNORMAL LOW (ref 12.0–15.0)
MCH: 31.4 pg (ref 26.0–34.0)
MCHC: 34.9 g/dL (ref 30.0–36.0)
MCV: 90 fL (ref 78.0–100.0)
PLATELETS: 153 10*3/uL (ref 150–400)
RBC: 2.9 MIL/uL — AB (ref 3.87–5.11)
RDW: 15.2 % (ref 11.5–15.5)
WBC: 7.9 10*3/uL (ref 4.0–10.5)

## 2016-03-22 NOTE — Progress Notes (Signed)
Patient ID: Autumn Johnson, female   DOB: 02/22/1990, 26 y.o.   MRN: [id number]  POSTPARTUM PROGRESS NOTE  Post Op/Partum Day #1  Subjective:  Autumn Johnson is a 26 y.o. [id number] 1573w4d s/p RLTCS.  No acute events overnight.  Pt denies problems with ambulating, voiding or po intake.  She denies nausea or vomiting.  Pain is well controlled.  She has had flatus. She has not had bowel movement.  Lochia Minimal.   Objective: Blood pressure 106/69, pulse 93, temperature 97.5 F (36.4 C), temperature source Oral, resp. rate 18, height 4\' 11"  (1.499 m), weight 223 lb (101.2 kg), last menstrual period 05/12/2015, SpO2 98 %, unknown if currently breastfeeding.  Physical Exam:  General: alert, cooperative and no distress Lochia:normal flow Chest: CTAB Heart: RRR no m/r/g Abdomen: +BS, soft, nontender,  Uterine Fundus: firm, below umbilicus DVT Evaluation: No calf swelling or tenderness Extremities: Trace edema   Recent Labs  03/21/16 0255 03/22/16 0523  HGB 12.2 9.1*  HCT 34.9* 26.1*    Assessment/Plan:  ASSESSMENT: Autumn Johnson is a 26 y.o. [id number] 6873w4d s/p RLTCS.  Plan for discharge tomorrow, Breastfeeding, Lactation consult and Contraception Depo   LOS: 1 day   Jen MowElizabeth Mumaw, DO OB Fellow [facility]  03/22/2016, 8:59 AM

## 2016-03-22 NOTE — Lactation Note (Signed)
This note was copied from a baby's chart. Lactation Consultation Note  Patient Name: Autumn Johnson: 03/22/2016 Reason for consult: Follow-up assessment Baby at 35 hr of life. Upon entry mom stated she just fed the baby. Mom reports she does not have enough milk despite large drops of colostrum being easily expressed. She puts baby to breast for 5-10 minutes then f/u with formula at each feeding. Reviewed formula volume guidelines. Mom has everted nipples with inverted centers. Th R nipple completely everts relatively easy. There is a thin horizontal line of light pink skin on the nipple surface when it is completely erect. The entire L nipple is more inverted with a more deeply inverted center. There is a bright red, painful, horizontal line across the nipple surface when the nipple is stimulated. The L nipple does not appear to become erect as easily. Given comfort gels. Discussed baby behavior, feeding frequency, baby belly size, voids, wt loss, breast changes, and nipple care. Mom will bf on demand and f/u with formula per volume guidelines. She is aware of lactation services and support group.     Maternal Data    Feeding Feeding Type: Bottle Fed - Formula Nipple Type: Slow - flow Length of feed: 10 min  LATCH Score/Interventions          Comfort (Breast/Nipple): Filling, red/small blisters or bruises, mild/mod discomfort  Problem noted: Mild/Moderate discomfort;Cracked, bleeding, blisters, bruises Interventions  (Cracked/bleeding/bruising/blister): Expressed breast milk to nipple Interventions (Mild/moderate discomfort): Comfort gels        Lactation Tools Discussed/Used     Consult Status Consult Status: Follow-up Johnson: 03/23/16 Follow-up type: In-patient    Rulon Eisenmengerlizabeth E Anastacia Reinecke 03/22/2016, 4:55 PM

## 2016-03-23 ENCOUNTER — Encounter (HOSPITAL_COMMUNITY)
Admission: RE | Admit: 2016-03-23 | Discharge: 2016-03-23 | Disposition: A | Discharge status: Home / Self Care | Payer: Self-pay | Source: Ambulatory Visit | Attending: Internal Medicine | Admitting: Internal Medicine

## 2016-03-23 HISTORY — DX: Other specified health status: Z78.9

## 2016-03-23 MED ORDER — OXYCODONE-ACETAMINOPHEN 5-325 MG PO TABS: 1.0000 | ORAL_TABLET | ORAL | 0 refills | Status: DC | PRN

## 2016-03-23 MED ORDER — DOCUSATE SODIUM 100 MG PO CAPS: 100.0000 mg | ORAL_CAPSULE | Freq: Two times a day (BID) | ORAL | 0 refills | Status: DC

## 2016-03-23 MED ORDER — IBUPROFEN 600 MG PO TABS: 600.0000 mg | ORAL_TABLET | Freq: Four times a day (QID) | ORAL | 0 refills | Status: DC

## 2016-03-23 NOTE — Discharge Instructions (Signed)
Parto por cesárea, cuidados posteriores °(Cesarean Delivery, Care After) °Siga estas instrucciones durante las próximas semanas. Estas indicaciones le proporcionan información acerca de cómo deberá cuidarse después del procedimiento. El médico también podrá darle instrucciones más específicas. El tratamiento ha sido planificado según las prácticas médicas actuales, pero en algunos casos pueden ocurrir problemas. Comuníquese con el médico si tiene algún problema o dudas después del procedimiento. °QUÉ ESPERAR DESPUÉS DEL PROCEDIMIENTO °Después del procedimiento, es común tener los siguientes síntomas: °· Una pequeña cantidad de sangre o un líquido transparente que sale de la incisión. °· Tiene enrojecimiento, hinchazón o dolor en la zona de la incisión. °· Dolores y molestias abdominales. °· Hemorragia vaginal (loquios). °· Calambres pélvicos. °· Fatiga. °INSTRUCCIONES PARA EL CUIDADO EN EL HOGAR °Cuidado de la incisión °· Siga las indicaciones del médico acerca del cuidado de la incisión. Haga lo siguiente: °? Lávese las manos con agua y jabón antes de cambiar las vendas (vendaje). Use desinfectante para manos si no dispone de agua y jabón. °? Cambie el vendaje como se lo haya indicado el médico. °? No retire los puntos (suturas), las grapas cutáneas, el pegamento para la piel o las tiras adhesivas. Es posible que estos deban quedar puestos en la piel durante 2 semanas o más tiempo. Si los bordes de las tiras adhesivas empiezan a despegarse y enroscarse, puede recortar los que están sueltos. No retire las tiras adhesivas por completo a menos que el médico se lo indique. °· Controle todos los días la zona de la incisión para detectar signos de infección. Esté atenta a los siguientes signos: °? Aumento del enrojecimiento, la hinchazón o el dolor. °? Más líquido o sangre. °? Calor. °? Pus o mal olor. °· Cuando tosa o estornude, abrace una almohada. Esto ayuda con el dolor y disminuye la posibilidad de que su incisión  se abra (dehiscencia). Haga esto hasta que cicatrice completamente. °Medicamentos °· Tome los medicamentos de venta libre y los recetados solamente como se lo haya indicado el médico. °· Si le recetaron un antibiótico, tómelo como se lo haya indicado el médico. No interrumpa la administración del antibiótico hasta que no lo hay terminado. °Conducir °· No conduzca ni opere maquinaria pesada mientras toma analgésicos recetados. °· No conduzca durante 24 horas si le administraron un sedante. °Estilo de vida °· No beba alcohol. Esto es de suma importancia si está amamantando o toma analgésicos. °· No consuma productos que contengan tabaco, incluidos cigarrillos, tabaco de mascar o cigarrillos electrónicos. Si necesita ayuda para dejar de fumar, consulte al médico. El tabaco puede retrasar la cicatrización. °Comida y bebida °· Beba al menos 8 vasos de 8 onzas (240 cc) de agua todos los días a menos que el médico le indique lo contrario. Si amamanta, quizá deba beber aún más cantidad de agua. °· Ingiera alimentos ricos en fibras todos los días. Estos alimentos pueden ayudarla a prevenir o aliviar el estreñimiento. Los alimentos ricos en fibras incluyen, entre otros: °? Panes y cereales integrales. °? Arroz integral. °? Frijoles. °? Frutas y verduras frescas. °Actividad °· Reanude sus actividades normales como se lo haya indicado el médico. Pregúntele al médico qué actividades son seguras para usted. °· Descanse todo lo que pueda. Trate de descansar o tomar una siesta mientras el bebé duerme. °· No levante objetos que pesen más que su bebé o más de 10 libras (4,5 kg), como se lo haya indicado el médico. °· Pregúntele al médico cuándo puede volver a tener relaciones sexuales. Esto puede depender de lo siguiente: °? Riesgo de sufrir   infecciones. °? Velocidad de cicatrización. °? Comodidad y deseo de tener relaciones sexuales. °El baño °· No tome baños de inmersión, no nade ni use el jacuzzi hasta que el médico lo autorice.  Pregúntele al médico si puede ducharse. Tal vez solo le permitan darse baños de esponja hasta que la incisión se cure. °· Mantenga el vendaje seco, como se lo haya indicado el médico. °Instrucciones generales °· No use tampones ni se haga duchas vaginales hasta que el médico la autorice. °· Use lo siguiente: °? Ropa cómoda y suelta. °? Un sostén firme y bien ajustado. °· Controle la sangre que elimina por la vagina para detectar coágulos. Pueden tener el aspecto de grumos de color rojo oscuro, marrón o negro. °· Mantenga el perineo limpio y seco, como se lo haya indicado el médico. °· Cuando vaya al baño, siempre higienícese de adelante hacia atrás. °· Si es posible, consiga que alguien la ayude a cuidar de su bebé y con las tareas domésticas durante algunos días después de recibir el alta hospitalaria °· Concurra a todas las visitas de seguimiento para usted y el bebé, como se lo haya indicado el médico. Esto es importante. °SOLICITE ATENCIÓN MÉDICA SI: °· Tiene los siguientes síntomas: °? Una secreción vaginal con mal olor. °? Dificultad para orinar. °? Dolor al orinar. °? Aumento o disminución repentinos de la frecuencia con que defeca. °? Aumento del enrojecimiento, de la hinchazón o del dolor alrededor de la incisión. °? Aumento del líquido o de la sangre que sale de la incisión. °? Pus o mal olor en el lugar de la incisión. °? Fiebre. °? Una erupción cutánea. °? Poco interés o falta de interés en actividades que solían gustarle. °? Dudas sobre su cuidado y el del bebé. °? Náuseas. °· La incisión está caliente al tacto. °· Sus senos se ponen rojos o duros, o causan dolor. °· Siente tristeza o preocupación de forma inusual. °· Vomita. °· Elimina coágulos grandes por la vagina. Si elimina un coágulo de sangre, guárdelo para mostrárselo al médico. No elimine los coágulos sanguíneos por el inodoro sin mostrárselos a su médico. °· Orina más de lo habitual. °· Se siente mareada o se desmaya. °· No amamantó al bebé y  no tuvo su período menstrual en un período de 12 semanas posterior al parto. °· Usted dejó de amamantar al bebé y no tuvo su período menstrual en un período de 12 semanas posterior a haber dejado de amamantar. ° °SOLICITE ATENCIÓN MÉDICA DE INMEDIATO SI: °· Tiene los siguientes síntomas: °? Dolor que no desaparece o no mejora con el medicamento. °? Dolor en el pecho. °? Dificultad para respirar. °? Visión borrosa o manchas en la vista. °? Pensamientos de autolesionarse o lesionar al bebé. °? Nuevo dolor en el abdomen o en una de las piernas. °? Dolor de cabeza intenso. °· Se desmaya. °· Tiene una hemorragia tan intensa de la vagina que empapa dos toallitas sanitarias en una hora. ° °Esta información no tiene como fin reemplazar el consejo del médico. Asegúrese de hacerle al médico cualquier pregunta que tenga. °Document Released: 01/23/2005 Document Revised: 05/17/2015 Document Reviewed: 12/28/2014 °Elsevier Interactive Patient Education © 2017 [employer] ° °

## 2016-03-23 NOTE — Discharge Summary (Signed)
OB Discharge Summary     Patient Name: Autumn Johnson DOB: April 05, 1990 MRN: [id number]  Date of admission: 03/21/2016 Delivering MD: Hermina Staggers   Date of discharge: 03/23/2016  Admitting diagnosis: 28.3 days contractions and pain Intrauterine pregnancy: [redacted]w[redacted]d     Secondary diagnosis:  Principal Problem:   Status post repeat low transverse cesarean section Active Problems:   Postpartum care following cesarean delivery  Additional problems: None     Discharge diagnosis: Term Pregnancy Delivered                                                                                                Post partum procedures:None  Augmentation: None  Complications: None  Hospital course:  Sceduled C/S   26 y.o. yo [id number] at [redacted]w[redacted]d was admitted to the hospital 03/21/2016 for scheduled cesarean section with the following indication:Elective Repeat and Spontaneous labor with spontaneous rupture of membrane.  Membrane Rupture Time/Date: 5:50 AM ,03/21/2016   Patient delivered a Viable infant.03/21/2016  Details of operation can be found in separate operative note.  Pateint had an uncomplicated postpartum course.  She is ambulating, tolerating a regular diet, passing flatus, and urinating well. Patient is discharged home in stable condition on  03/23/16         Physical exam  Vitals:   03/22/16 0300 03/22/16 0500 03/22/16 1817 03/23/16 0500  BP: (!) 102/59 106/69 107/67 111/68  Pulse: 72 93 88 79  Resp: Temp: 97.7 F (36.5 C) 97.5 F (36.4 C) 98.4 F (36.9 C) 97.4 F (36.3 C)  TempSrc: Oral Oral Oral Oral  SpO2:      Weight:      Height:       General: alert, cooperative and no distress Lochia: appropriate Uterine Fundus: firm Incision: Healing well with no significant drainage, No significant erythema, Dressing is clean, dry, and intact DVT Evaluation: No evidence of DVT seen on physical exam. Negative Homan's sign. No cords or calf tenderness. Labs: Lab Results   Component Value Date   WBC 7.9 03/22/2016   HGB 9.1 (L) 03/22/2016   HCT 26.1 (L) 03/22/2016   MCV 90.0 03/22/2016   PLT 153 03/22/2016   CMP Latest Ref Rng & Units 01/05/2016  Glucose 65 - 104 mg/dL 73  BUN 6 - 20 mg/dL -  Creatinine 4.01 - 0.27 mg/dL -  Sodium 253 - 664 mmol/L -  Potassium 3.5 - 5.1 mmol/L -  Chloride 101 - 111 mmol/L -  CO2 22 - 32 mmol/L -  Calcium 8.9 - 10.3 mg/dL -  Total Protein 6.5 - 8.1 g/dL -  Total Bilirubin 0.3 - 1.2 mg/dL -  Alkaline Phos 38 - 403 U/L -  AST 15 - 41 U/L -  ALT 14 - 54 U/L -    Discharge instruction: per After Visit Summary and "Baby and Me Booklet".  After visit meds:  Allergies as of 03/23/2016   No Known Allergies     Medication List    TAKE these medications   docusate sodium 100 MG capsule Commonly known as:  COLACE  Take 1 capsule (100 mg total) by mouth 2 (two) times daily.   ibuprofen 600 MG tablet Commonly known as:  ADVIL,MOTRIN Take 1 tablet (600 mg total) by mouth every 6 (six) hours.   oxyCODONE-acetaminophen 5-325 MG tablet Commonly known as:  ROXICET Take 1-2 tablets by mouth every 4 (four) hours as needed for severe pain.   Prenatal Vitamins 0.8 MG tablet Take 1 tablet by mouth daily.       Diet: routine diet  Activity: Advance as tolerated. Pelvic rest for 6 weeks.   Outpatient follow up:2 weeks for incision check, 6 weeks for postpartum follow up and Depo Follow up Appt:No future appointments. Follow up Visit:No Follow-up on file.  Postpartum contraception: Depo Provera  Newborn Data: Live born female  Birth Weight: 7 lb 4.1 oz (3290 g) APGAR: 8, 10  Baby Feeding: Bottle and Breast Disposition:home with mother   03/23/2016 Jen Mow, DO OB Fellow

## 2016-03-23 NOTE — Lactation Note (Signed)
This note was copied from a baby's chart. Lactation Consultation Note  Patient Name: Autumn Johnson NWGNF'AToday's Date: 03/23/2016 Reason for consult: Follow-up assessment  Baby 51 hours old. Used [employer] interpreter "Onalee HuaDavid" [phone]#[id number]. Mom reports that she intends to nurse for just a few days and then give formula by bottle as she did with first 2 children. Mom states that she has used the NS off-and-on depending on whether her nipples are sore. Mom reports that comfort gels are helping with nipple healing and soreness. Discussed with mom that if she continues to use the NS, she will need to pump. Discussed DEBP with mom, but she reports that she does not want to use DEBP. Mom aware of OP/BFSG and LC phone line assistance after D/C. Mom declined assistance with latching baby at this time, but enc to call for assistance with next feeding. Maternal Data    Feeding    LATCH Score/Interventions                      Lactation Tools Discussed/Used     Consult Status Consult Status: Complete    Sherlyn HayJennifer D Liisa Picone 03/23/2016, 9:08 AM

## 2016-03-24 ENCOUNTER — Inpatient Hospital Stay (HOSPITAL_COMMUNITY): Admission: RE | Admit: 2016-03-24 | Payer: Self-pay | Source: Ambulatory Visit | Admitting: Family Medicine

## 2016-03-24 ENCOUNTER — Encounter (HOSPITAL_COMMUNITY): Admission: RE | Payer: Self-pay | Source: Ambulatory Visit

## 2016-03-24 SURGERY — Surgical Case
Anesthesia: Regional | Site: Abdomen

## 2016-03-25 LAB — TYPE AND SCREEN
BLOOD PRODUCT EXPIRATION DATE: 201803082359
Blood Product Expiration Date: 201803082359
Unit Type and Rh: 5100
Unit Type and Rh: 5100

## 2016-04-04 ENCOUNTER — Ambulatory Visit: Payer: Self-pay

## 2016-04-04 VITALS — BP 97/65 | HR 71

## 2016-04-04 DIAGNOSIS — Z5189 Encounter for other specified aftercare: Secondary | ICD-10-CM

## 2016-04-04 NOTE — Progress Notes (Signed)
Patient presented to office today for incision check. Patient reports no drainage or pain at this time. She does reports some lower back pain since having the baby. She is not taking any medications at this time for her back pain. I advised patient to start taking some tylenol every six hours for pain. If this does not get better patient will need to follow up with our office. Patient voice understanding.

## 2016-05-08 ENCOUNTER — Encounter: Payer: Self-pay | Admitting: Internal Medicine

## 2016-05-08 ENCOUNTER — Ambulatory Visit (INDEPENDENT_AMBULATORY_CARE_PROVIDER_SITE_OTHER): Payer: Self-pay | Admitting: Internal Medicine

## 2016-05-08 VITALS — BP 110/80 | HR 70 | Temp 98.6°F | Wt 199.0 lb

## 2016-05-08 DIAGNOSIS — Z3042 Encounter for surveillance of injectable contraceptive: Secondary | ICD-10-CM

## 2016-05-08 DIAGNOSIS — Z3040 Encounter for surveillance of contraceptives, unspecified: Secondary | ICD-10-CM

## 2016-05-08 LAB — POCT URINE PREGNANCY: Preg Test, Ur: NEGATIVE

## 2016-05-08 MED ORDER — MEDROXYPROGESTERONE ACETATE 150 MG/ML IM SUSP
150.0000 mg | Freq: Once | INTRAMUSCULAR | Status: AC
  Administered 2016-05-08: 150 mg via INTRAMUSCULAR

## 2016-05-08 NOTE — Patient Instructions (Signed)
  Fue tan agradable verte! Puede regresar a toda su actividad normal. Lo veremos de regreso en 1 ao o antes si es necesario.  -Dr. Nancy Marus

## 2016-05-08 NOTE — Progress Notes (Signed)
   [facility] Phone: [phone]  Subjective:  Autumn Johnson is a 26 year old female presenting to clinic for a 6 week postpartum appointment. She states she is doing well. No concerns. She denies any depression or feelings of sadness. She had a C-section, and states that her scar has been healing well. No erythema or drainage. She has had normal bowel movements and is urinating like normal. She started having sexual intercourse with her partner last Wednesday, because it was 6 weeks after her baby was born. No dyspareunia. She started her period a few days ago. She is interested in getting Depo today for birth control.  ROS: See HPI for pertinent positives and negatives  Past Medical History- s/p repeat LTCS x 3, obesity  Family history reviewed for today's visit. No changes.  Social history- patient is a never smoker  Objective: BP 110/80   Pulse 70   Temp 98.6 F (37 C) (Oral)   Wt 199 lb (90.3 kg)   LMP 04/07/2016   SpO2 99%   BMI 40.19 kg/m  Gen: NAD, alert, cooperative with exam HEENT: NCAT, EOMI, MMM CV: RRR, no murmur Resp: CTABL, no wheezes, normal work of breathing GI: Well-healing horizontal C/S scar, no erythema, no drainage Msk: No edema, warm, normal tone, moves UE/LE spontaneously  Assessment/Plan: Postpartum Care After C/S: Doing well, no concerns. Discussed different birth control options. Patient would like to proceed with Depo shot, because she has used this in the past. - Urine pregnancy test was negative - Depo shot given today - Edinburgh postnatal depression scale score was a 0 - Okay to resume all normal activities  Willadean Carol, MD PGY-2

## 2016-05-08 NOTE — Assessment & Plan Note (Signed)
Doing well, no concerns. Discussed different birth control options. Patient would like to proceed with Depo shot, because she has used this in the past. - Urine pregnancy test was negative - Depo shot given today - Edinburgh postnatal depression scale score was a 0 - Okay to resume all normal activities

## 2016-08-07 ENCOUNTER — Ambulatory Visit (INDEPENDENT_AMBULATORY_CARE_PROVIDER_SITE_OTHER): Payer: Self-pay | Admitting: *Deleted

## 2016-08-07 DIAGNOSIS — Z3042 Encounter for surveillance of injectable contraceptive: Secondary | ICD-10-CM

## 2016-08-07 MED ORDER — MEDROXYPROGESTERONE ACETATE 150 MG/ML IM SUSP
150.0000 mg | Freq: Once | INTRAMUSCULAR | Status: AC
  Administered 2016-08-07: 150 mg via INTRAMUSCULAR

## 2016-08-07 NOTE — Progress Notes (Signed)
   Pt in for Depo Provera injection.  Pt tolerated Depo injection. Depo given right upper outer quadrant.  Next injection due Sept 17-Oct. 1, 2018.  Reminder card given. Clovis PuMartin, Tamika L, RN

## 2016-08-18 NOTE — Anesthesia Postprocedure Evaluation (Signed)
Anesthesia Post Note  Patient: Autumn Johnson  Procedure(s) Performed: Procedure(s) (LRB): CESAREAN SECTION (N/A)     Anesthesia Post Evaluation  Last Vitals:  Vitals:   03/22/16 1817 03/23/16 0500  BP: 107/67 111/68  Pulse: 88 79  Resp: 18 18  Temp: 36.9 C 36.3 C    Last Pain:  Vitals:   03/23/16 0857  TempSrc:   PainSc: 4                  Lewie LoronJohn Mcadoo Muzquiz

## 2016-08-18 NOTE — Addendum Note (Signed)
Addendum  created 08/18/16 0917 by Lewie LoronGermeroth, Mardell Cragg, MD   Sign clinical note

## 2017-02-26 IMAGING — US US OB COMP LESS 14 WK
1 series · 15 of 28 positions shown · non-contrast
Comparison: No prior scans from this gestation.

CLINICAL DATA: 24-year-old pregnant female presenting with pink
discharge since this morning and cramping. Pending quantitative beta
HCG. Uncertain LMP.

EXAM:
OBSTETRIC <14 WK US AND TRANSVAGINAL OB US
TECHNIQUE: Both transabdominal and transvaginal ultrasound examinations were
performed for complete evaluation of the gestation as well as the
maternal uterus, adnexal regions, and pelvic cul-de-sac.
Transvaginal technique was performed to assess early pregnancy.

[Series 1: us ob comp less 14 wk · 15 of 41 slices shown]
[im 1/41]
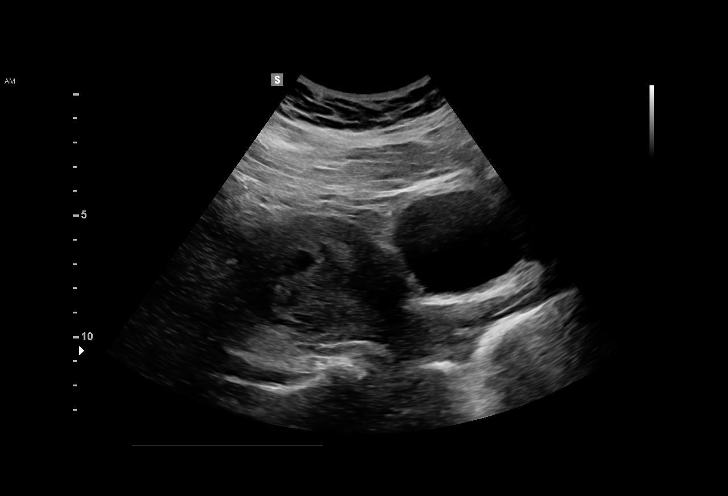
[im 3/41]
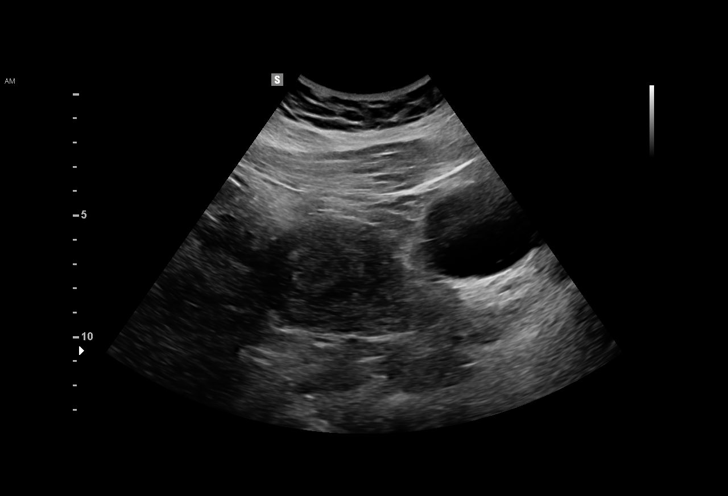
[im 6/41]
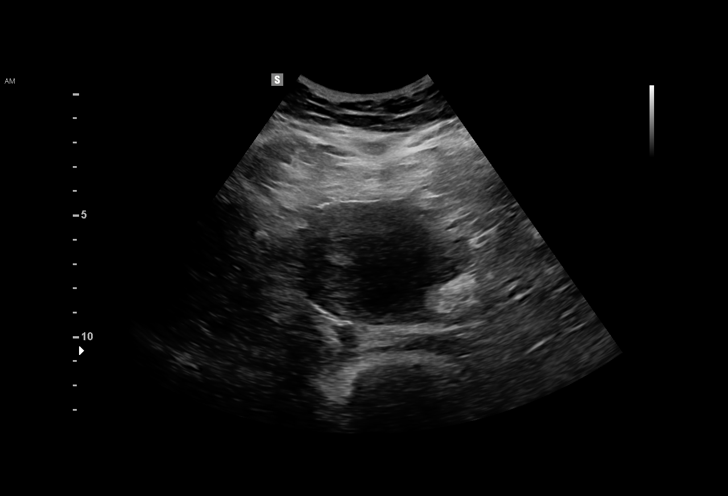
[im 9/41]
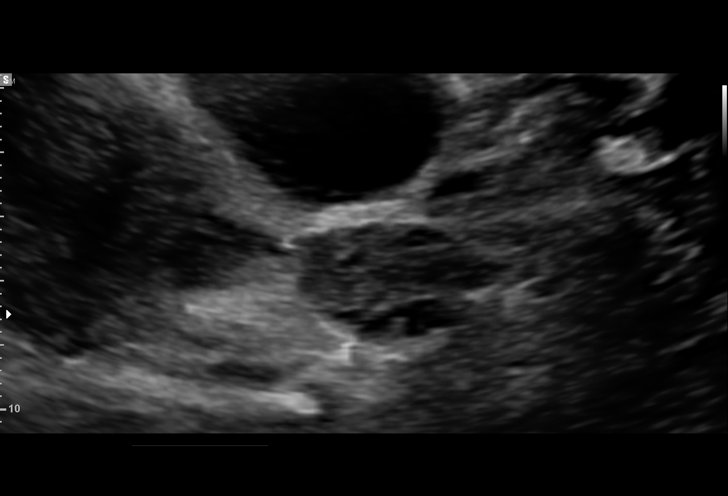
[im 12/41]
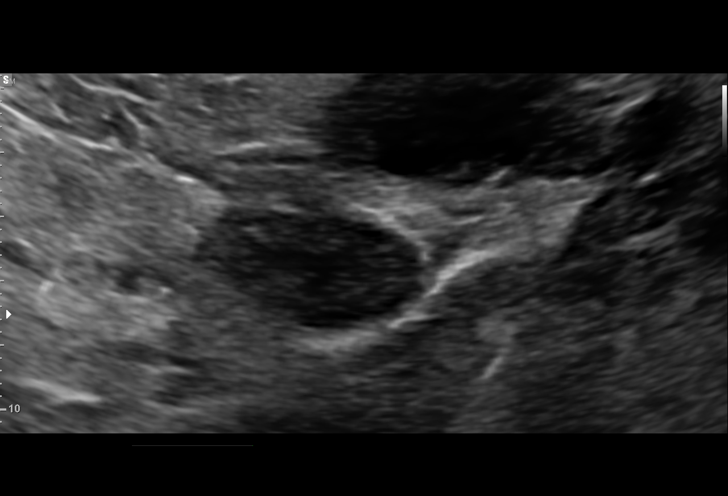
[im 15/41]
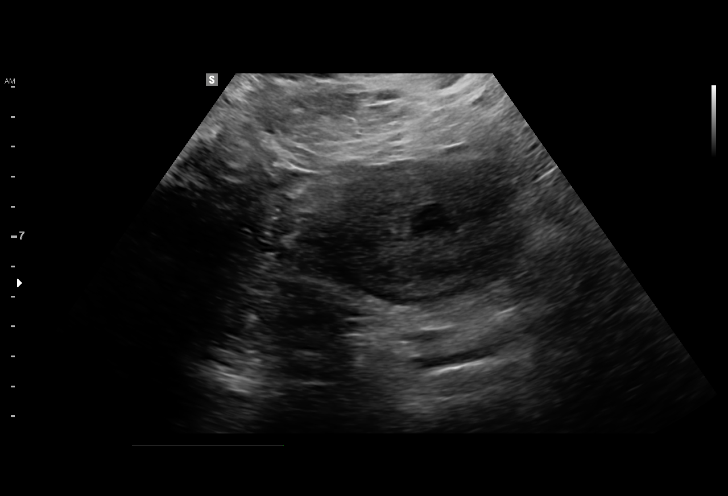
[im 18/41]
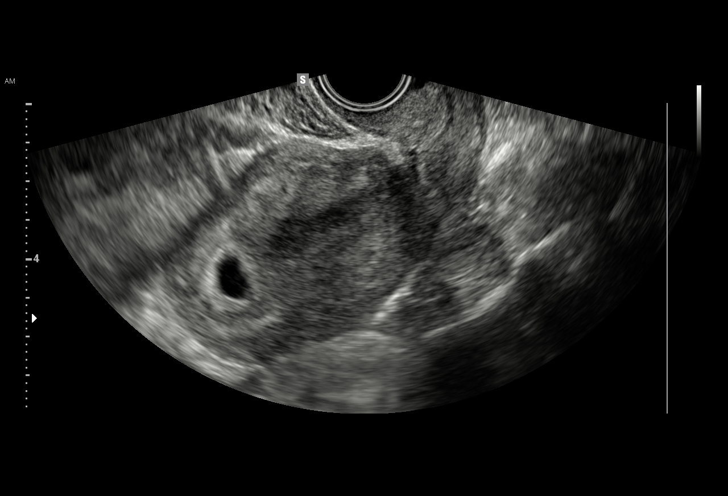
[im 21/41]
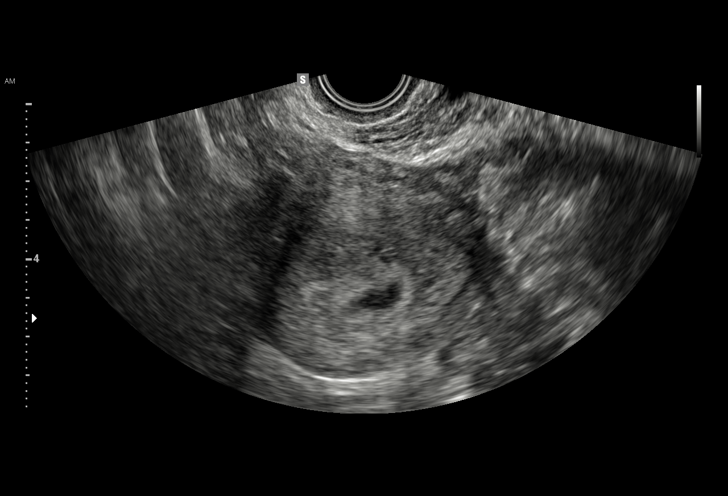
[im 23/41]
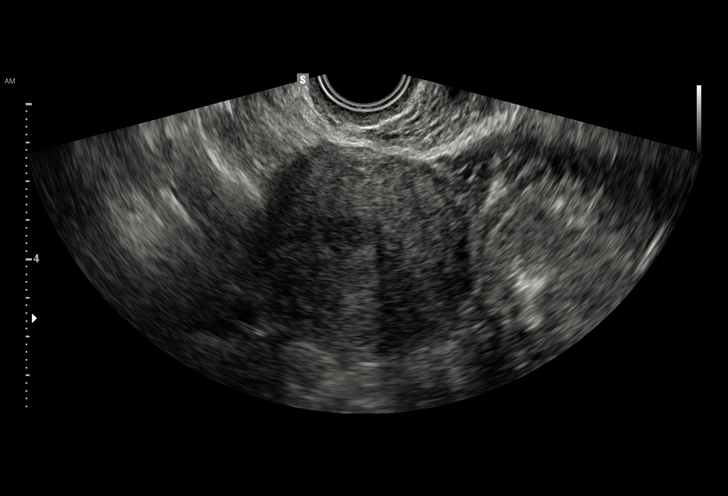
[im 26/41]
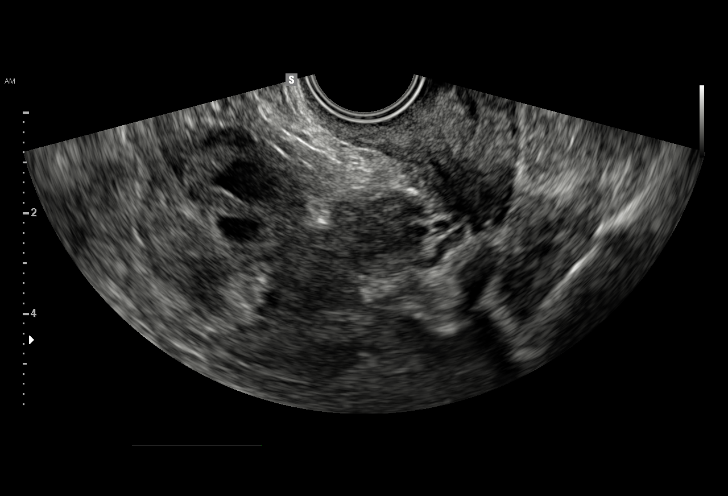
[im 29/41]
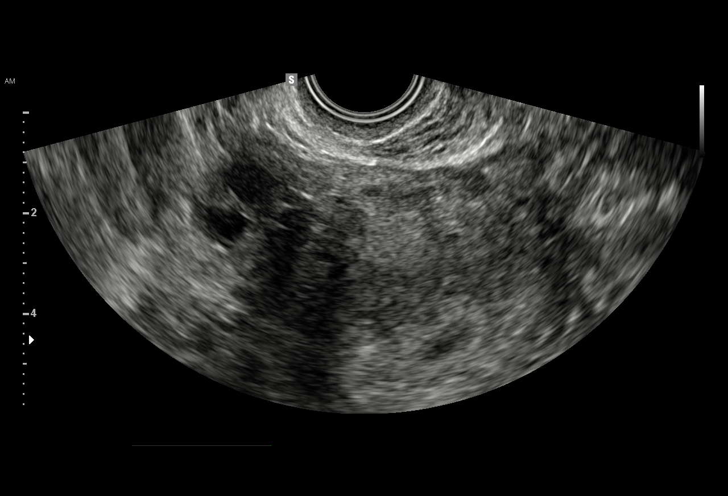
[im 32/41]
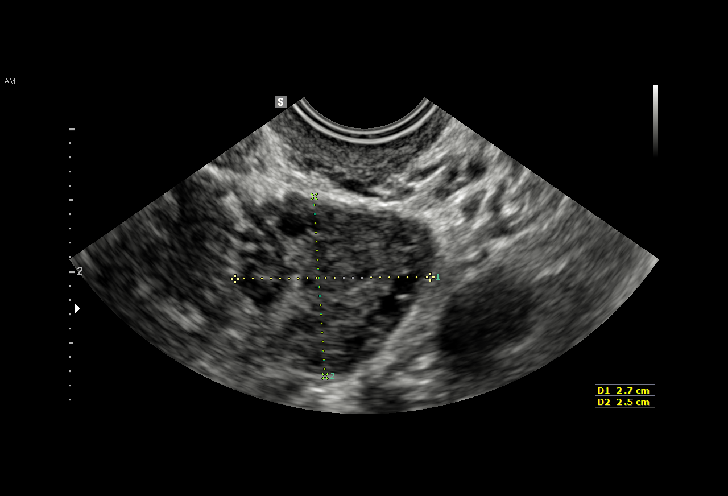
[im 35/41]
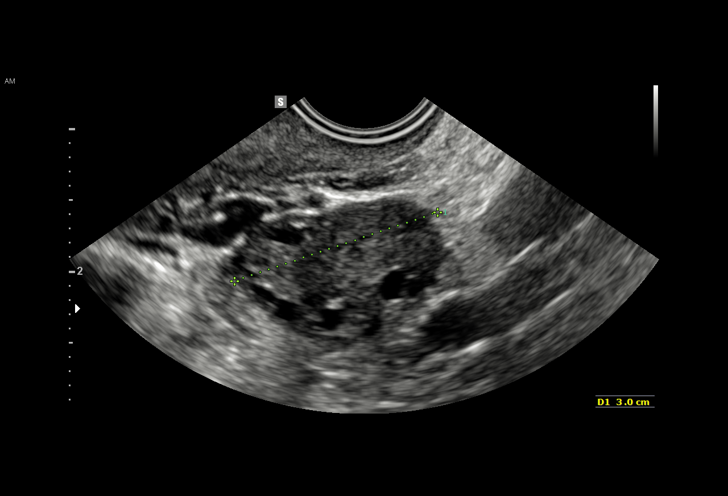
[im 38/41]
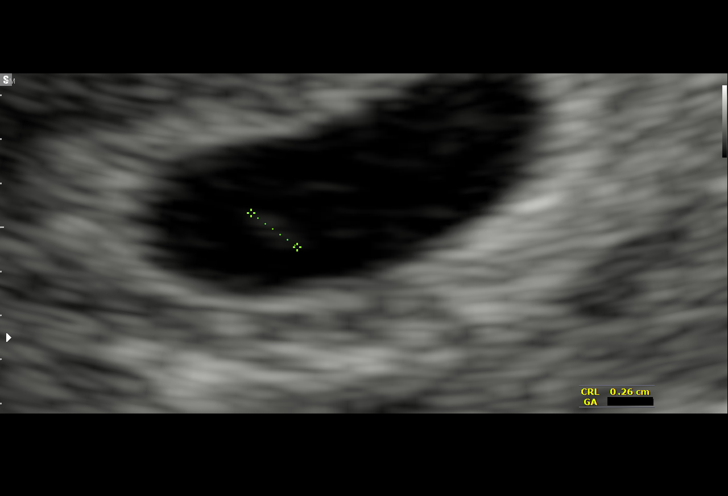
[im 41/41]
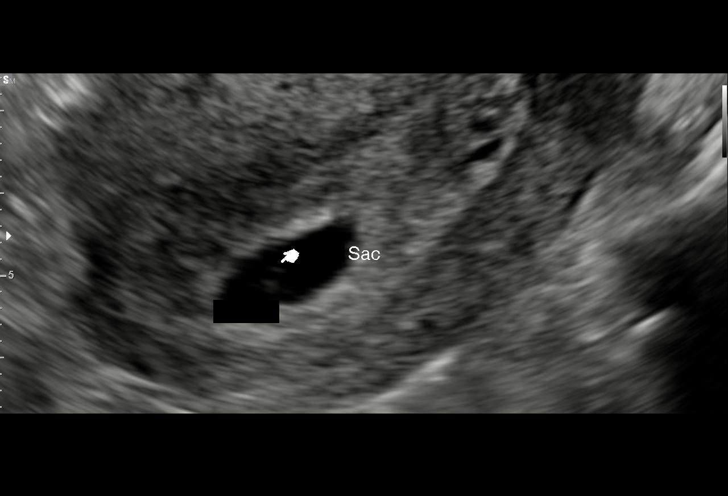

[15 of 28 positions shown; findings below may reference images not displayed]

FINDINGS: Intrauterine gestational sac: Single intrauterine gestational sac
appears normal in size, shape and position.

Yolk sac:  Present.

Embryo:  Present.

Embryonic Cardiac Activity: Regular rate and rhythm.

Embryonic Heart Rate: 101  bpm

CRL:  2.8  mm   5 w   6 d                  US EDC: 03/31/2016

Subchorionic hemorrhage:  None visualized.

Maternal uterus/adnexae: Cesarean scar is seen in the anterior lower
uterine segment. No uterine fibroids are demonstrated. Right ovary
measures 3.3 x 3.2 x 2.5 cm. Left ovary measures 2.7 x 2.5 x 3.0 cm.
No suspicious ovarian or adnexal masses. No abnormal free fluid in
the pelvis.
IMPRESSION: 1. Single living intrauterine gestation of 5 weeks 6 days by
crown-rump length. Embryonic heart rate 101 beats per minute, within
normal limits for this early gestational age.
2. No first-trimester gestational abnormality.
3. No ovarian or adnexal masses.

## 2017-02-27 ENCOUNTER — Encounter: Payer: Self-pay | Admitting: Family Medicine

## 2017-02-27 ENCOUNTER — Other Ambulatory Visit: Payer: Self-pay

## 2017-02-27 ENCOUNTER — Ambulatory Visit (INDEPENDENT_AMBULATORY_CARE_PROVIDER_SITE_OTHER): Payer: Self-pay | Admitting: Family Medicine

## 2017-02-27 VITALS — BP 108/64 | HR 100 | Temp 98.7°F | Ht 59.45 in | Wt 201.6 lb

## 2017-02-27 DIAGNOSIS — M545 Low back pain, unspecified: Secondary | ICD-10-CM

## 2017-02-27 DIAGNOSIS — R1030 Lower abdominal pain, unspecified: Secondary | ICD-10-CM

## 2017-02-27 LAB — POCT WET + KOH PREP
Trich by wet prep: ABSENT
Yeast by KOH: ABSENT
Yeast by wet prep: ABSENT

## 2017-02-27 LAB — POCT URINE PREGNANCY: Preg Test, Ur: NEGATIVE

## 2017-02-27 LAB — POCT URINALYSIS DIP (MANUAL ENTRY)
Bilirubin, UA: NEGATIVE
Blood, UA: NEGATIVE
Glucose, UA: NEGATIVE mg/dL
Ketones, POC UA: NEGATIVE mg/dL
Leukocytes, UA: NEGATIVE
Nitrite, UA: NEGATIVE
Protein Ur, POC: NEGATIVE mg/dL
Spec Grav, UA: 1.01 (ref 1.010–1.025)
Urobilinogen, UA: 0.2 E.U./dL
pH, UA: 7 (ref 5.0–8.0)

## 2017-02-27 MED ORDER — IBUPROFEN 600 MG PO TABS: 600.0000 mg | ORAL_TABLET | Freq: Three times a day (TID) | ORAL | 0 refills | Status: DC | PRN

## 2017-02-27 NOTE — Patient Instructions (Signed)
Low Back Strain With Rehab Low Back Strain Rehab Consulte al mdico qu ejercicios son seguros para usted. Haga los ejercicios exactamente como se lo haya indicado el mdico y gradelos como se lo hayan indicado. Es normal sentir un leve estiramiento, tirn, rigidez o molestia cuando haga estos ejercicios, pero debe detenerse de inmediato si siento un dolor repentino o si el dolor empeora. No comience a hacer estos ejercicios hasta que se lo indique el mdico. EJERCICIOS DE [employer] Y AMPLITUD DE MOVIMIENTOS Estos ejercicios calientan los msculos y las articulaciones, y mejoran el movimiento y la flexibilidad de la espalda. Estos ejercicios tambin ayudan a Engineer, materials, el adormecimiento y el hormigueo. Ejercicio A: Rodilla al pecho 1. Acustese boca arriba en una superficie firme con las piernas extendidas. 2. Flexione una rodilla. Tome la rodilla con las manos y llvela hacia el pecho hasta que sienta un estiramiento suave en la parte inferior de la espalda y las nalgas ? Mantenga la otra pierna lo ms extendida posible. ? Mantenga la otra pierna lo ms extendida posible. 3. Mantenga esta posicin durante __________ segundos. 4. Vuelva lentamente a la posicin inicial. 5. Repita el ejercicio con la otra pierna.  Repita __________ veces. Realice este ejercicio __________ veces al da. Ejercicio B: Extensin [employer] codos, en decbito prono 1. Acustese boca abajo sobre una superficie firme. 2. Apyese sobre los codos. 3. Con los brazos, aydese a Haematologist sentir un leve estiramiento en el abdomen y la parte inferior de la espalda. ? Electrical engineer algo de [employer] codos. Si no se siente cmodo, intente colocando almohadas debajo del pecho. ? Debe dejar la cadera inmvil sobre la superficie en la que est apoyado. Mantenga la cadera y los msculos de la espalda relajados. 4. Mantenga esta posicin durante __________ segundos. 5. Afloje lentamente la parte superior  del cuerpo y vuelva a la posicin inicial.  Repita __________ veces. Realice este ejercicio __________ veces al da. STRENGTHENING EXERCISES Estos ejercicios fortalecen la espalda y le otorgan resistencia. La resistencia es la capacidad de usar los msculos durante un tiempo prolongado, incluso despus de que se cansen. Ejercicio C: Inclinacin de la pelvis 1. Acustese boca arriba sobre una superficie firme. Flexione las rodillas y [employer]. 2. Tensione los msculos abdominales. Eleve la pelvis hacia el techo y aplane la parte inferior de la espalda contra el suelo. ? Para realizar este ejercicio, puede colocar una toalla pequea debajo de la parte inferior de la espalda y presionar la espalda contra la toalla. 3. Mantenga esta posicin durante __________ segundos. 4. Relaje totalmente los msculos antes de repetir el ejercicio.  Repita __________ veces. Realice este ejercicio __________ veces al da. Ejercicio D: Elevaciones alternadas de pierna y brazo 1. Apoye las palmas de las manos y las rodillas sobre una superficie firme. Si se colocar sobre una superficie muy dura, puede usar un elemento acolchado para apoyar las rodillas, como una alfombrilla para ejercicios. 2. Alinee los brazos y las piernas. Las manos deben estar debajo de los hombros y las rodillas debajo de la cadera. 3. Eleve la pierna izquierda hacia atrs. Al mismo tiempo, eleve el brazo derecho y Associate Professor frente a usted. ? No eleve la pierna por encima de la cadera. ? No eleve el brazo por encima del hombro. ? Mantenga los msculos del abdomen y de la espalda contrados. ? Mantenga la cadera [employer] el suelo. ? No arquee la espalda. ? Mantenga el equilibrio con cuidado y  no contenga la respiracin. 4. Mantenga esta posicin durante __________ segundos. 5. Lentamente regrese a la posicin inicial y repita el ejercicio con la pierna derecha y el brazo izquierdo.  Repita __________ veces. Realice  este ejercicio __________ veces al da. Ejercicio J: Bajar una pierna con rodillas flexionadas 1. Acustese boca arriba sobre una superficie firme. 2. Apriete los msculos abdominales y [employer] del piso, uno a la vez, de modo que las rodillas y la cadera estn flexionadas en forma de "L" (a aproximadamente 90 grados). ? Las rodillas deben estar por encima de la cadera y las pantorrillas deben quedar paralelas al piso. 3. Con los msculos abdominales tensos y la rodilla flexionada, baje lentamente una pierna de modo que los dedos del pie toquen el suelo. 4. Levante la pierna para volver a la posicin inicial. ? No contenga la respiracin. ? No deje que la espalda se arquee. Mantenga la espalda plana contra el suelo. 5. Repita el ejercicio con la otra pierna.  Repita __________ veces. Realice este ejercicio __________ veces al da. Tyson Dense Y [employer]NICA [employer]PORAL La Water quality scientist se refiere a los movimientos y a las posiciones del cuerpo mientras realiza las actividades diarias. La postura es una parte de la Water quality scientist. La buena postura y la Administrator, sports corporal saludable pueden ayudar a Acupuncturist estrs en las articulaciones y los tejidos del cuerpo. La buena postura significa que la columna mantiene su posicin natural de curvatura en forma de S (la columna est en una posicin neutral), los hombros Zenaida Niece un poco hacia atrs y la cabeza no se inclina hacia adelante. A continuacin, se incluyen pautas generales para mejorar la postura y Regulatory affairs officer en las actividades diarias. De pie  Al estar de pie, mantenga la columna en la posicin neutral y los pies separados al ancho de caderas, aproximadamente. Mantenga las rodillas ligeramente flexionadas. Las Rend[place], los hombros y las caderas deben estar alineados.  Cuando realice una tarea en la que deba estar de pie en el mismo sitio durante mucho tiempo, coloque un pie en un objeto estable de 2 a 4 pulgadas (5 a 10 cm) de alto, como  un taburete. Esto ayuda a que la columna mantenga una posicin neutral.  Sentado  Cuando est sentado, mantenga la columna en posicin neutral y deje los pies apoyados en el suelo. Use un apoyapis, si es necesario, y [employer] muslos paralelos al suelo. Evite redondear los hombros e inclinar la cabeza hacia adelante.  Cuando trabaje en un escritorio o con una computadora, el escritorio debe estar a una altura en la que las manos estn un poco ms abajo que los codos. Deslice la silla debajo del escritorio, de modo de estar lo suficientemente cerca como para mantener una buena [place].  Cuando trabaje con una computadora, coloque el monitor a una altura que le permita mirar derecho hacia adelante, sin tener que inclinar la cabeza hacia adelante o Burt atrs.  Reposo Al descansar o estar acostado, evite las posiciones que le causen ms dolor.  Si siente dolor al hacer actividades que exigen sentarse, inclinarse, agacharse o ponerse en cuclillas (actividades basadas en la flexin), acustese en una posicin en la que el cuerpo no deba doblarse mucho. Por ejemplo, evite acurrucarse de costado con los brazos y las rodillas cerca del pecho (posicin fetal).  Si siente dolor con las actividades que exigen estar de pie durante mucho tiempo o Printmaker brazos (actividades basadas en la extensin), acustese con la columna en Neomia Dear  posicin neutral y flexione ligeramente las rodillas. Pruebe con las siguientes posiciones: ? Teacher, musicAcostarse de costado con una almohada entre las rodillas. ? Acostarse boca arriba con una almohada debajo de las rodillas.  Levantar objetos  Cuando tenga que levantar un objeto, mantenga los pies separados el ancho de los hombros y apriete los msculos abdominales.  Flexione las rodillas y la cadera, y Dietitianmantenga la columna en posicin neutral. Es importante levantar utilizando la fuerza de las piernas, no de la espalda. No trabe las rodillas hacia afuera.  Siempre pida ayuda a  otra persona para levantar objetos pesados o incmodos.  Esta informacin no tiene Theme park managercomo fin reemplazar el consejo del mdico. Asegrese de hacerle al mdico cualquier pregunta que tenga. Document Released: 11/09/2005 Document Revised: 06/09/2014 Document Reviewed: 11/04/2014 Elsevier Interactive Patient Education  2018 [employer]

## 2017-02-27 NOTE — Progress Notes (Signed)
1/22/20199:56 AM  Autumn Johnson [birth year], 27 y.o. female [id number]  Chief Complaint  Patient presents with  . Abdominal Pain     since last Thursday  . Back Pain    sine last Thursday    HPI:   Patient is a 27 y.o. female who presents today for 6 days of dull achy of LLQ and L lower back. Pain is constant but then becomes colicky and more intense during sexual intercourse. It reminds her of when she had BV during her last pregnancy  She denies any vaginal discharge, dysuria, hematuria, urgency or frequency She denies any fever, chills, nausea, vomiting, diarrhea, blood in stool, or black tarry stool She endorses constipation She has had 3 c-sections She is on depo, last injection 02/05/17, reports amenorrhea She restarted the gym 2 weeks ago  Last pap 12/2015, normal  Depression screen [facility] 2/9 08/07/2016 03/17/2016 03/10/2016  Decreased Interest 0 0 0  Down, Depressed, Hopeless 0 0 0  PHQ - 2 Score 0 0 0    No Known Allergies  Prior to Admission medications   Not on File    Past Medical History:  Diagnosis Date  . Medical history non-contributory   . Obesity     Past Surgical History:  Procedure Laterality Date  . CESAREAN SECTION    . CESAREAN SECTION N/A 03/21/2016   Procedure: CESAREAN SECTION;  Surgeon: Hermina Staggers, MD;  Location: [facility] BIRTHING SUITES;  Service: Obstetrics;  Laterality: N/A;    Social History   Tobacco Use  . Smoking status: Never Smoker  . Smokeless tobacco: Never Used  Substance Use Topics  . Alcohol use: No    Family History  Problem Relation Age of Onset  . Diabetes Mother        Pre-diabetes  . Hypertension Mother   . Hyperlipidemia Father     ROS Per hpi  OBJECTIVE   Physical Exam  Constitutional: She is oriented to person, place, and time and well-developed, well-nourished, and in no distress.  HENT:  Head: Normocephalic and atraumatic.  Mouth/Throat: Oropharynx is clear and moist. No oropharyngeal exudate.    Eyes: EOM are normal. Pupils are equal, round, and reactive to light. No scleral icterus.  Neck: Neck supple.  Cardiovascular: Normal rate, regular rhythm and normal heart sounds. Exam reveals no gallop and no friction rub.  No murmur heard. Pulmonary/Chest: Effort normal and breath sounds normal. She has no wheezes. She has no rales.  Abdominal: Soft. Bowel sounds are normal. She exhibits no distension. There is hepatosplenomegaly. There is tenderness in the right lower quadrant, suprapubic area and left lower quadrant. There is no rebound, no guarding, no CVA tenderness and no tenderness at McBurney's point.  Genitourinary: Uterus is not enlarged, not fixed and not tender.  Cervix is not fixed. Cervix exhibits no motion tenderness. Right adnexum displays no mass and no tenderness. Left adnexum displays no mass and no tenderness. Vulva exhibits no erythema, no lesion and no rash. Vagina exhibits normal mucosa and no lesion. No vaginal discharge found.  Musculoskeletal: She exhibits no edema.       Lumbar back: She exhibits tenderness (left lower lumbar paraspinal). She exhibits normal range of motion, no bony tenderness and no spasm.  Neurological: She is alert and oriented to person, place, and time. Gait normal.  Skin: Skin is warm and dry.    Results for orders placed or performed in visit on 02/27/17 (from the past 24 hour(s))  POCT urine pregnancy  Status: None   Collection Time: 02/27/17 10:04 AM  Result Value Ref Range   Preg Test, Ur Negative Negative  POCT urinalysis dipstick     Status: None   Collection Time: 02/27/17 10:05 AM  Result Value Ref Range   Color, UA yellow yellow   Clarity, UA clear clear   Glucose, UA negative negative mg/dL   Bilirubin, UA negative negative   Ketones, POC UA negative negative mg/dL   Spec Grav, UA [id number] - 1.025   Blood, UA negative negative   pH, UA 7.0 5.0 - 8.0   Protein Ur, POC negative negative mg/dL   Urobilinogen, UA 0.2 0.2  or 1.0 E.U./dL   Nitrite, UA Negative Negative   Leukocytes, UA Negative Negative  POCT Wet + KOH Prep     Status: None   Collection Time: 02/27/17 10:42 AM  Result Value Ref Range   Yeast by KOH Absent Absent   Yeast by wet prep Absent Absent   WBC by wet prep Few Few   Clue Cells Wet Prep HPF POC None None   Trich by wet prep Absent Absent   Bacteria Wet Prep HPF POC Few Few   Epithelial Cells By Principal FinancialWet Pref ([facility]) Few None, Few, Too numerous to count   RBC,UR,HPF,POC None None RBC/hpf     ASSESSMENT and PLAN  1. Acute left-sided low back pain without sciatica Discussed supportive measures, new meds r/se/b and RTC precautions. Patient educational handout given. - ibuprofen (ADVIL,MOTRIN) 600 MG tablet; Take 1 tablet (600 mg total) by mouth every 8 (eight) hours as needed.  2. Lower abdominal pain - POCT urine pregnancy - POCT urinalysis dipstick - POCT Wet + KOH Prep - GC/Chlamydia Probe Amp([facility])     Return if symptoms worsen or fail to improve.    Myles LippsIrma M Santiago, MD [facility] [address] [place], [place] [address] Ph.  [phone] Fax [phone]

## 2017-03-01 LAB — GC/CHLAMYDIA PROBE AMP
Chlamydia trachomatis, NAA: NEGATIVE
Neisseria gonorrhoeae by PCR: NEGATIVE

## 2017-12-17 ENCOUNTER — Other Ambulatory Visit: Payer: Self-pay

## 2017-12-17 ENCOUNTER — Encounter (HOSPITAL_COMMUNITY): Payer: Self-pay | Admitting: Emergency Medicine

## 2017-12-17 ENCOUNTER — Ambulatory Visit (HOSPITAL_COMMUNITY)
Admission: EM | Admit: 2017-12-17 | Discharge: 2017-12-17 | Disposition: A | Discharge status: Home / Self Care | Payer: Self-pay | Attending: Urgent Care | Admitting: Urgent Care

## 2017-12-17 DIAGNOSIS — R200 Anesthesia of skin: Secondary | ICD-10-CM

## 2017-12-17 DIAGNOSIS — R202 Paresthesia of skin: Secondary | ICD-10-CM

## 2017-12-17 DIAGNOSIS — Z833 Family history of diabetes mellitus: Secondary | ICD-10-CM

## 2017-12-17 LAB — POCT I-STAT, CHEM 8
BUN: 9 mg/dL (ref 6–20)
CALCIUM ION: 1.2 mmol/L (ref 1.15–1.40)
Chloride: 105 mmol/L (ref 98–111)
Creatinine, Ser: 0.4 mg/dL — ABNORMAL LOW (ref 0.44–1.00)
Glucose, Bld: 100 mg/dL — ABNORMAL HIGH (ref 70–99)
HEMATOCRIT: 46 % (ref 36.0–46.0)
Hemoglobin: 15.6 g/dL — ABNORMAL HIGH (ref 12.0–15.0)
Potassium: 3.8 mmol/L (ref 3.5–5.1)
SODIUM: 140 mmol/L (ref 135–145)
TCO2: 24 mmol/L (ref 22–32)

## 2017-12-17 LAB — POCT URINALYSIS DIP (DEVICE)
BILIRUBIN URINE: NEGATIVE
Glucose, UA: NEGATIVE mg/dL
KETONES UR: NEGATIVE mg/dL
Leukocytes, UA: NEGATIVE
NITRITE: NEGATIVE
PROTEIN: NEGATIVE mg/dL
Specific Gravity, Urine: 1.02 (ref 1.005–1.030)
Urobilinogen, UA: 0.2 mg/dL (ref 0.0–1.0)
pH: 6 (ref 5.0–8.0)

## 2017-12-17 NOTE — ED Triage Notes (Signed)
The patient presented to the [facility] with a complaint of tingling to the side of her head that she sleeps on x 2 weeks.

## 2017-12-17 NOTE — ED Provider Notes (Signed)
MRN: [id number] DOB: 25-Jan-1991  Subjective:   Autumn Johnson is a 27 y.o. female presenting for 2 week history of intermittent tingling of her hands > feet, numbness of her scalp when she lays on that particular side. Her mother has a history of HTN, DM II, history of stroke.  She is concerned that she is also having similar symptoms due to her risk of diabetes with her mother.  Denies fever, headaches, confusion, trauma, falls, blurred or double vision, throat pain, cough, chest pain, shortness of breath, nausea, vomiting, belly pain, urinary frequency, dysuria.  Patient reports that she tries to practice a healthy diet and limits her carbohydrates.  Denies smoking cigarettes or drinking alcohol.   She is not currently taking any medications.    No Known Allergies  Past Medical History:  Diagnosis Date  . Medical history non-contributory   . Obesity      Past Surgical History:  Procedure Laterality Date  . CESAREAN SECTION    . CESAREAN SECTION N/A 03/21/2016   Procedure: CESAREAN SECTION;  Surgeon: Hermina Staggers, MD;  Location: [facility] BIRTHING SUITES;  Service: Obstetrics;  Laterality: N/A;    Objective:   Vitals: BP 123/84 (BP Location: Left Arm)   Pulse 74   Temp 98.1 F (36.7 C) (Oral)   Resp 18   SpO2 100%   Physical Exam  Constitutional: She is oriented to person, place, and time. She appears well-developed and well-nourished.  HENT:  Mouth/Throat: Oropharynx is clear and moist.  Eyes: Pupils are equal, round, and reactive to light. EOM are normal. Right eye exhibits no discharge. Left eye exhibits no discharge. No scleral icterus.  Neck: Normal range of motion. Neck supple. No thyromegaly present.  Cardiovascular: Normal rate, regular rhythm and intact distal pulses. Exam reveals no gallop and no friction rub.  No murmur heard. Pulmonary/Chest: No stridor. No respiratory distress. She has no wheezes. She has no rales.  Abdominal: Soft. Bowel sounds are normal. She  exhibits no distension and no mass. There is no tenderness. There is no rebound and no guarding.  Neurological: She is alert and oriented to person, place, and time. She displays normal reflexes. No cranial nerve deficit.  Skin: Skin is warm and dry.   Results for orders placed or performed during the hospital encounter of 12/17/17 (from the past 24 hour(s))  I-STAT, chem 8     Status: Abnormal   Collection Time: 12/17/17  3:55 PM  Result Value Ref Range   Sodium 140 135 - 145 mmol/L   Potassium 3.8 3.5 - 5.1 mmol/L   Chloride 105 98 - 111 mmol/L   BUN 9 6 - 20 mg/dL   Creatinine, Ser 2.95 (L) 0.44 - 1.00 mg/dL   Glucose, Bld 284 (H) 70 - 99 mg/dL   Calcium, Ion 1.32 1.15 - 1.40 mmol/L   TCO2 24 22 - 32 mmol/L   Hemoglobin 15.6 (H) 12.0 - 15.0 g/dL   HCT 44.0 10.2 - 72.5 %  POCT urinalysis dip (device)     Status: Abnormal   Collection Time: 12/17/17  3:56 PM  Result Value Ref Range   Glucose, UA NEGATIVE NEGATIVE mg/dL   Bilirubin Urine NEGATIVE NEGATIVE   Ketones, ur NEGATIVE NEGATIVE mg/dL   Specific Gravity, Urine 1.020 1.005 - 1.030   Hgb urine dipstick TRACE (A) NEGATIVE   pH 6.0 5.0 - 8.0   Protein, ur NEGATIVE NEGATIVE mg/dL   Urobilinogen, UA 0.2 0.0 - 1.0 mg/dL   Nitrite NEGATIVE NEGATIVE  Leukocytes, UA NEGATIVE NEGATIVE    Assessment and Plan :   Numbness  Paresthesias  Family history of diabetes mellitus  Discussed differential diagnosis with patient at length.  Currently her physical exam findings are normal including a normal neurologic exam.  I tried to reassure patient but also recommended that she needs further work-up.  Strict ER and return to clinic precautions given.  Counseled on need for primary care doctor so that she can continue working on her current symptoms.   Wallis Bamberg, PA-C 12/17/17 1610

## 2019-02-07 ENCOUNTER — Encounter (HOSPITAL_COMMUNITY): Payer: Self-pay

## 2019-02-07 ENCOUNTER — Ambulatory Visit (HOSPITAL_COMMUNITY)
Admission: EM | Admit: 2019-02-07 | Discharge: 2019-02-07 | Disposition: A | Discharge status: Home / Self Care | Payer: Self-pay | Attending: Emergency Medicine | Admitting: Emergency Medicine

## 2019-02-07 ENCOUNTER — Other Ambulatory Visit: Payer: Self-pay

## 2019-02-07 DIAGNOSIS — Z3491 Encounter for supervision of normal pregnancy, unspecified, first trimester: Secondary | ICD-10-CM

## 2019-02-07 DIAGNOSIS — R1030 Lower abdominal pain, unspecified: Secondary | ICD-10-CM

## 2019-02-07 DIAGNOSIS — Z3201 Encounter for pregnancy test, result positive: Secondary | ICD-10-CM

## 2019-02-07 LAB — POC URINE PREG, ED
Preg Test, Ur: POSITIVE — AB
Preg Test, Ur: POSITIVE — AB

## 2019-02-07 LAB — POCT PREGNANCY, URINE: Preg Test, Ur: POSITIVE — AB

## 2019-02-07 NOTE — ED Triage Notes (Signed)
Pt has already taking two pregnancy at home and needs a confirmation of her pregnancy

## 2019-02-07 NOTE — ED Provider Notes (Signed)
[facility]    CSN: [id number] Arrival date & time: 02/07/19  1636      History   Chief Complaint Chief Complaint  Patient presents with  . Possible Pregnancy    HPI Nasreen Goedecke is a 29 y.o. female.   Patient reports to urgent care today for pregnacny test. She reports 2 at home pregnancy tests being positive and comes in today for confirmation. She reports mild lower abdominal discomfort but no other concerns. Her last menstrual period was 16 Nov 2018.   This is her 4th pregnancy and she will seek care following the visit for pre-natal care.      Past Medical History:  Diagnosis Date  . Medical history non-contributory   . Obesity     Patient Active Problem List   Diagnosis Date Noted  . Postpartum care following cesarean delivery 03/21/2016  . Obesity 03/05/2016  . Status post repeat low transverse cesarean section 08/05/2015  . Language barrier, cultural differences 08/05/2015    Past Surgical History:  Procedure Laterality Date  . CESAREAN SECTION    . CESAREAN SECTION N/A 03/21/2016   Procedure: CESAREAN SECTION;  Surgeon: Hermina Staggers, MD;  Location: [facility] BIRTHING SUITES;  Service: Obstetrics;  Laterality: N/A;    OB History    Gravida  4   Para  3   Term  3   Preterm      AB  1   Living  3     SAB  1   TAB      Ectopic      Multiple  0   Live Births  2            Home Medications    Prior to Admission medications   Medication Sig Start Date End Date Taking? Authorizing Provider  ibuprofen (ADVIL,MOTRIN) 600 MG tablet Take 1 tablet (600 mg total) by mouth every 8 (eight) hours as needed. 02/27/17   Myles Lipps, MD    Family History Family History  Problem Relation Age of Onset  . Diabetes Mother        Pre-diabetes  . Hypertension Mother   . Hyperlipidemia Father     Social History Social History   Tobacco Use  . Smoking status: Never Smoker  . Smokeless tobacco: Never Used  Substance Use  Topics  . Alcohol use: No  . Drug use: No     Allergies   Patient has no known allergies.   Review of Systems Review of Systems  Constitutional: Negative for chills and fever.  Respiratory: Negative for cough and shortness of breath.   Cardiovascular: Negative for chest pain and palpitations.  Gastrointestinal: Positive for abdominal pain. Negative for vomiting.  Genitourinary: Negative for dysuria, hematuria, menstrual problem, vaginal bleeding and vaginal pain.  Musculoskeletal: Negative for arthralgias and back pain.  Neurological: Negative for syncope.  All other systems reviewed and are negative.    Physical Exam Triage Vital Signs ED Triage Vitals [02/07/19 1721]  Enc Vitals Group     BP (!) 103/59     Pulse Rate 77     Resp 16     Temp      Temp src      SpO2 100 %     Weight      Height      Head Circumference      Peak Flow      Pain Score 0     Pain Loc  Pain Edu?      Excl. in [place]?    No data found.  Updated Vital Signs BP (!) 103/59 (BP Location: Right Arm)   Pulse 77   Resp 16   SpO2 100%   Visual Acuity Right Eye Distance:   Left Eye Distance:   Bilateral Distance:    Right Eye Near:   Left Eye Near:    Bilateral Near:     Physical Exam Vitals and nursing note reviewed.  Constitutional:      General: She is not in acute distress.    Appearance: Normal appearance. She is well-developed and normal weight.  HENT:     Head: Normocephalic and atraumatic.  Eyes:     Conjunctiva/sclera: Conjunctivae normal.     Pupils: Pupils are equal, round, and reactive to light.  Neurological:     General: No focal deficit present.     Mental Status: She is alert and oriented to person, place, and time.  Psychiatric:        Mood and Affect: Mood normal.        Behavior: Behavior normal.        Thought Content: Thought content normal.        Judgment: Judgment normal.      UC Treatments / Results  Labs (all labs ordered are listed, but  only abnormal results are displayed) Labs Reviewed  POCT PREGNANCY, URINE - Abnormal; Notable for the following components:      Result Value   Preg Test, Ur POSITIVE (*)    All other components within normal limits  POC URINE PREG, ED - Abnormal; Notable for the following components:   Preg Test, Ur POSITIVE (*)    All other components within normal limits  POC URINE PREG, ED - Abnormal; Notable for the following components:   Preg Test, Ur POSITIVE (*)    All other components within normal limits    EKG   Radiology No results found.  Procedures Procedures (including critical care time)  Medications Ordered in UC Medications - No data to display  Initial Impression / Assessment and Plan / UC Course  I have reviewed the triage vital signs and the nursing notes.  Pertinent labs & imaging results that were available during my care of the patient were reviewed by me and considered in my medical decision making (see chart for details).     #Pregnancy, 1st Trimester - positive POC pregnancy test her. Estimated Gestational age based on LMP 11wks 6 days. Patient given documentation in order to receive pre-natal care. No concerns at this time, and recommended pre-natal vitamin starting now.   Final Clinical Impressions(s) / UC Diagnoses   Final diagnoses:  Pregnancy test positive  Encounter for pregnancy related examination in first trimester     Discharge Instructions     Los resultados de su prueba de embarazo fueron positivos.  Busque atencin prenatal.    ED Prescriptions    None     PDMP not reviewed this encounter.   Purnell Shoemaker, PA-C 02/07/19 1810

## 2019-02-07 NOTE — Discharge Instructions (Signed)
Los [employer] de su prueba de Psychiatrist fueron positivos.  Busque atencin prenatal.

## 2019-03-13 ENCOUNTER — Other Ambulatory Visit: Payer: Self-pay

## 2019-03-13 ENCOUNTER — Other Ambulatory Visit (HOSPITAL_COMMUNITY)
Admission: RE | Admit: 2019-03-13 | Discharge: 2019-03-13 | Disposition: A | Discharge status: Home / Self Care | Payer: Self-pay | Source: Ambulatory Visit | Attending: Family Medicine | Admitting: Family Medicine

## 2019-03-13 ENCOUNTER — Encounter: Payer: Self-pay | Admitting: Family Medicine

## 2019-03-13 ENCOUNTER — Ambulatory Visit (INDEPENDENT_AMBULATORY_CARE_PROVIDER_SITE_OTHER): Payer: Self-pay | Admitting: Family Medicine

## 2019-03-13 VITALS — BP 110/76 | HR 73 | Wt 213.4 lb

## 2019-03-13 DIAGNOSIS — Z3481 Encounter for supervision of other normal pregnancy, first trimester: Secondary | ICD-10-CM | POA: Insufficient documentation

## 2019-03-13 LAB — POCT URINALYSIS DIP (MANUAL ENTRY)
Bilirubin, UA: NEGATIVE
Blood, UA: NEGATIVE
Glucose, UA: NEGATIVE mg/dL
Ketones, POC UA: NEGATIVE mg/dL
Leukocytes, UA: NEGATIVE
Nitrite, UA: NEGATIVE
Protein Ur, POC: NEGATIVE mg/dL
Spec Grav, UA: 1.01 (ref 1.010–1.025)
Urobilinogen, UA: 0.2 E.U./dL
pH, UA: 6.5 (ref 5.0–8.0)

## 2019-03-13 NOTE — Patient Instructions (Addendum)
Segundo trimestre de embarazo Second Trimester of Pregnancy  El segundo trimestre va desde la semana14 hasta la 27 (desde el mes 4 hasta el 6). Este suele ser el momento en el que mejor se siente. En general, las nuseas matutinas han disminuido o han desaparecido completamente. Tendr ms energa y podr aumentarle el apetito. El beb en gestacin se desarrolla rpidamente. Hacia el final del sexto mes, el beb mide aproximadamente 9 pulgadas (23 cm) y pesa alrededor de 1 libras (700 g). Es probable que sienta al beb moverse entre las 18 y 20 semanas del embarazo. Siga estas indicaciones en su casa: Medicamentos  Tome los medicamentos de venta libre y los recetados solamente como se lo haya indicado el mdico. Algunos medicamentos son seguros para tomar durante el embarazo y otros no lo son.  Tome vitaminas prenatales que contengan por lo menos 600microgramos (?g) de cido flico.  Si tiene dificultad para mover el intestino (estreimiento), tome un medicamento para ablandar las heces (laxante) si su mdico se lo autoriza. Comida y bebida   Ingiera alimentos saludables de manera regular.  No coma carne cruda ni quesos sin cocinar.  Si obtiene poca cantidad de calcio de los alimentos que ingiere, consulte a su mdico sobre la posibilidad de tomar un suplemento diario de calcio.  Evite el consumo de alimentos ricos en grasas y azcares, como los alimentos fritos y los dulces.  Si tiene malestar estomacal (nuseas) o devuelve (vomita): ? Ingiera 4 o 5comidas pequeas por da en lugar de 3abundantes. ? Intente comer algunas galletitas saladas. ? Beba lquidos entre las comidas, en lugar de hacerlo durante estas.  Para evitar el estreimiento: ? Consuma alimentos ricos en fibra, como frutas y verduras frescas, cereales integrales y frijoles. ? Beba suficiente lquido para mantener el pis (orina) claro o de color amarillo plido. Actividad  Haga ejercicios solamente como se lo haya  indicado el mdico. Interrumpa la actividad fsica si comienza a tener calambres.  No haga ejercicio si hace demasiado calor, hay demasiada humedad o se encuentra en un lugar de mucha altura (altitud alta).  Evite levantar pesos excesivos.  Use zapatos con tacones bajos. Mantenga una buena postura al sentarse y pararse.  Puede continuar teniendo relaciones sexuales, a menos que el mdico le indique lo contrario. Alivio del dolor y del malestar  Use un sostn que le brinde buen soporte si sus mamas estn sensibles.  Dese baos de asiento con agua tibia para aliviar el dolor o las molestias causadas por las hemorroides. Use una crema para las hemorroides si el mdico la autoriza.  Descanse con las piernas elevadas si tiene calambres o dolor de cintura.  Si desarrolla venas hinchadas y abultadas (vrices) en las piernas: ? Use medias de compresin o medias de descanso como se lo haya indicado el mdico. ? Levante (eleve) los pies durante 15minutos, 3 o 4veces por da. ? Limite el consumo de sal en sus alimentos. Cuidado prenatal  Escriba sus preguntas. Llvelas cuando concurra a las visitas prenatales.  Concurra a todas las visitas prenatales como se lo haya indicado el mdico. Esto es importante. Seguridad  Colquese el cinturn de seguridad cuando conduzca.  Haga una lista de los nmeros de telfono de emergencia, que incluya los nmeros de telfono de familiares, amigos, el hospital, as como los departamentos de polica y bomberos. Instrucciones generales  Consulte a su mdico sobre los alimentos que debe comer o pdale que la ayude a encontrar a quien pueda aconsejarla si necesita ese servicio.    Consulte a su mdico acerca de dnde se dictan clases prenatales cerca de donde vive. Comience las clases antes del mes 6 de embarazo.  No se d baos de inmersin en agua caliente, baos turcos ni saunas.  No se haga duchas vaginales ni use tampones o toallas higinicas perfumadas.   No mantenga las piernas cruzadas durante mucho tiempo.  Vaya al dentista si an no lo hizo. Use un cepillo de cerdas suaves para cepillarse los dientes. Psese el hilo dental suavemente.  No fume, no consuma hierbas ni beba alcohol. No tome frmacos que el mdico no haya autorizado.  No consuma ningn producto que contenga nicotina o tabaco, como cigarrillos y cigarrillos electrnicos. Si necesita ayuda para dejar de fumar, consulte al mdico.  Evite el contacto con las bandejas sanitarias de los gatos y la tierra que estos animales usan. Estos elementos contienen bacterias que pueden causar defectos congnitos al beb y la posible prdida del beb (aborto espontneo) o la muerte fetal. Comunquese con un mdico si:  Tiene clicos leves o siente presin en la parte baja del vientre.  Tiene dolor al hacer pis (orinar).  Advierte un lquido con olor ftido que proviene de la vagina.  Tiene malestar estomacal (nuseas), devuelve (vomita) o tiene deposiciones acuosas (diarrea).  Sufre un dolor persistente en el abdomen.  Siente mareos. Solicite ayuda de inmediato si:  Tiene fiebre.  Tiene una prdida de lquido por la vagina.  Tiene sangrado o pequeas prdidas vaginales.  Siente dolor intenso o clicos en el abdomen.  Sube o baja de peso rpidamente.  Tiene dificultades para recuperar el aliento y siente dolor en el pecho.  Sbitamente se le hinchan mucho el rostro, las manos, los tobillos, los pies o las piernas.  No ha sentido los movimientos del beb durante una hora.  Siente un dolor de cabeza intenso que no se alivia al tomar medicamentos.  Tiene dificultad para ver. Resumen  El segundo trimestre va desde la semana14 hasta la 27, desde el mes 4 hasta el 6. Este suele ser el momento en el que mejor se siente.  Para cuidarse y cuidar a su beb en gestacin, debe comer alimentos saludables, tomar medicamentos solamente si su mdico le indica que lo haga y hacer  actividades que sean seguras para usted y su beb.  Llame al mdico si se enferma o si nota algo inusual acerca de su embarazo. Tambin llame al mdico si necesita ayuda para saber qu alimentos debe comer o si quiere saber qu actividades puede realizar de forma segura. Esta informacin no tiene como fin reemplazar el consejo del mdico. Asegrese de hacerle al mdico cualquier pregunta que tenga. Document Revised: 10/18/2016 Document Reviewed: 10/18/2016 Elsevier Patient Education  2020 Elsevier Inc.  

## 2019-03-13 NOTE — Progress Notes (Addendum)
Patient Name: Autumn Johnson Date of Birth: 05/12/1990 [place] Initial Prenatal Visit  Due to language barrier, an interpreter was present during the history-taking and subsequent discussion (and for part of the physical exam) with this patient.   Autumn Johnson is a 29 y.o. year old [id number] at [redacted]w[redacted]d who presents for her initial prenatal visit. Pregnancy is planned She reports nausea and vomiting, abdominal pain, no contractions or leakage of fluid. She is taking a prenatal vitamin.  She denies pelvic pain or vaginal bleeding.   Vitals:   03/13/19 1405  BP: 110/76  Pulse: 73   FHR: attempted, but unable to obtain   Pregnancy Dating: . The patient is dated by LMP.  . LMP: 11/16/2018 . Period is certain:  Yes.  . Periods were regular:  No.  . LMP was a typical period:  Yes.  . Using hormonal contraception in 3 months prior to conception: No  Lab Review: . Blood type: O . Rh Status: + . Antibody screen: Not done, negative on 10/2015 . HIV: Pending, Neg 12/2015  . RPR: Pending, Neg 03/2016 . Hemoglobin electrophoresis reviewed: Pending, Neg SS on 01/2010 . Results of OB urine culture are: Pending . Rubella: Immune on 10/2015, repeat testing performed . Varicella status is Immune  PMH: Reviewed and as detailed below: . HTN: No  . Type 1 or 2 Diabetes: No  . Depression:  No  . Seizure disorder:  No . VTE: No ,  . History of STI No,  . Abnormal Pap smear:  No, . Genital herpes simplex:  No   PSH: . Gynecologic Surgery:  no . Surgical history reviewed, notable for: LCTSx3 for prior pregnancies  Obstetric History: . Obstetric history tab updated and reviewed.  . Summary of prior pregnancies:  . Preg1: SB @ 13, Preg 2: Term w/ LCTS due to fetal intolerance, Preg 3 & 4: Term w/ repeat LCTS . Cesarean delivery: Yes  . Gestational Diabetes:  No . Hypertension in pregnancy: No . History of preterm birth: No . History of LGA/SGA infant:   No . History of shoulder dystocia: No . Indications for referral were reviewed, and the patient has no obstetric indications for referral to [facility] at this time.   Social History: . Partner's name: Ruffin Frederick . Tobacco use: No . Alcohol use:  No . Other substance use:  No  Current Medications:  . None . Reviewed and appropriate in pregnancy.   Genetic and Infection Screen: . Flow Sheet Updated Yes  Prenatal Exam: Gen: Well nourished, well developed.  No distress.  Vitals noted. HEENT: Normocephalic, atraumatic.  Neck supple without cervical lymphadenopathy, thyromegaly or thyroid nodules.  Fair dentition. CV: RRR no murmur, gallops or rubs Lungs: CTA B.  Normal respiratory effort without wheezes or rales. Abd: soft, NTND. +BS.  Uterus not appreciated above pelvis. GU: Normal external female genitalia without lesions.  Nl vaginal, well rugated without lesions. No vaginal discharge.  Bimanual exam: No adnexal mass or TTP. No CMT.  Uterus size below naval Ext: No clubbing, cyanosis or edema. Psych: Normal grooming and dress.  Not depressed or anxious appearing.  Normal thought content and process without flight of ideas or looseness of associations  Examination chaperoned by April Zimmerman Rumple   Fetal heart tones: Not obtained, discussed with precptor, Anatomy Scan [place] ordered*  Assessment/Plan:  Autumn Johnson is a 29 y.o. [id number] at Unknown who presents to initiate prenatal care. She is doing well.  Current  pregnancy issues include none.  1. Routine prenatal care: Marland Kitchen As dating is not reliable, a dating ultrasound has been ordered. Dating tab updated. . Pre-pregnancy weight updated. Expected weight gain this pregnancy is 25-35 pounds  . Prenatal labs ordered, historical labs reviewed and unremarkable . Indications for referral to [facility] were reviewed and the patient does not meet criteria for referral. But will need referral due to need for repeat  C/S . Medication list reviewed and updated.  . Recommended patient see a dentist for regular care.  . Bleeding and pain precautions reviewed. . Importance of prenatal vitamins reviewed.  . Genetic screening offered. Patient opted for: no screening. . The patient will not be age 25 or over at time of delivery. Referral to genetic counseling was not offered today.  . The patient has the following risk factors for preexisting diabetes: BMI > 25 and high risk ethnicity (Latino, [place] American, Native American, [place] Islander, Asian Naval architect) . An early 1 hour glucose tolerance test was not ordered. Due to need to get initial OB labs and patient not having time to complete. Plan to obtain at next routine OB appointment.  . Pregnancy Medical Home was not incompleted when patient left. Patient to complete at next visit.  Marland Kitchen PHQ-9 forms not completed before patient left, recommend obtaining at next appointment:   Precepted with Dr. Lyn Hollingshead    Follow up 4 weeks for next prenatal visit.

## 2019-03-14 LAB — OBSTETRIC PANEL, INCLUDING HIV
Antibody Screen: NEGATIVE
Basophils Absolute: 0 10*3/uL (ref 0.0–0.2)
Basos: 0 %
EOS (ABSOLUTE): 0.1 10*3/uL (ref 0.0–0.4)
Eos: 1 %
HIV Screen 4th Generation wRfx: NONREACTIVE
Hematocrit: 38.8 % (ref 34.0–46.6)
Hemoglobin: 13.5 g/dL (ref 11.1–15.9)
Hepatitis B Surface Ag: NEGATIVE
Immature Grans (Abs): 0.1 10*3/uL (ref 0.0–0.1)
Immature Granulocytes: 1 %
Lymphocytes Absolute: 2.1 10*3/uL (ref 0.7–3.1)
Lymphs: 21 %
MCH: 32.5 pg (ref 26.6–33.0)
MCHC: 34.8 g/dL (ref 31.5–35.7)
MCV: 94 fL (ref 79–97)
Monocytes Absolute: 0.4 10*3/uL (ref 0.1–0.9)
Monocytes: 4 %
Neutrophils Absolute: 7.6 10*3/uL — ABNORMAL HIGH (ref 1.4–7.0)
Neutrophils: 73 %
Platelets: 250 10*3/uL (ref 150–450)
RBC: 4.15 x10E6/uL (ref 3.77–5.28)
RDW: 13 % (ref 11.7–15.4)
RPR Ser Ql: NONREACTIVE
Rh Factor: POSITIVE
Rubella Antibodies, IGG: 9.23 index (ref >0.99)
WBC: 10.3 10*3/uL (ref 3.4–10.8)

## 2019-03-15 LAB — URINE CULTURE, OB REFLEX

## 2019-03-15 LAB — CULTURE, OB URINE

## 2019-03-17 LAB — CYTOLOGY - PAP
Chlamydia: NEGATIVE
Comment: NEGATIVE
Comment: NEGATIVE
Comment: NORMAL
Diagnosis: NEGATIVE
Neisseria Gonorrhea: NEGATIVE
Trichomonas: NEGATIVE

## 2019-03-18 LAB — HGB FRAC. W/SOLUBILITY
Hgb A2 Quant: 2 % (ref 1.8–3.2)
Hgb A: 98 % (ref 96.4–98.8)
Hgb C: 0 %
Hgb F Quant: 0 % (ref 0.0–2.0)
Hgb S: 0 %
Hgb Solubility: NEGATIVE
Hgb Variant: 0 %

## 2019-04-10 ENCOUNTER — Other Ambulatory Visit: Payer: Self-pay

## 2019-04-10 ENCOUNTER — Ambulatory Visit (INDEPENDENT_AMBULATORY_CARE_PROVIDER_SITE_OTHER): Payer: Self-pay | Admitting: Family Medicine

## 2019-04-10 VITALS — BP 110/59 | HR 71 | Temp 98.1°F | Wt 213.0 lb

## 2019-04-10 DIAGNOSIS — Z3490 Encounter for supervision of normal pregnancy, unspecified, unspecified trimester: Secondary | ICD-10-CM | POA: Insufficient documentation

## 2019-04-10 DIAGNOSIS — Z3481 Encounter for supervision of other normal pregnancy, first trimester: Secondary | ICD-10-CM

## 2019-04-10 NOTE — Progress Notes (Signed)
    Autumn Johnson is a 29 y.o. [id number] at [redacted]w[redacted]d here for routine follow up. She is dated by 12-week Ultrasound.  She reports headache, no bleeding, no cramping, no leaking and rare contractions.  She denies vaginal bleeding.  See flow sheet for details.  Vitals:   04/10/19 1404  BP: (!) 110/59  Pulse: 71  Temp: 98.1 F (36.7 C)     A/P: Pregnancy at [redacted]w[redacted]d.  Doing well. Is taking her pre-natal vitamin every day. . Dating reviewed, dating tab is correct . Fetal heart tones Appropriate - were very difficult to find, located directly R of naval  . Pregnancy issues include: Obesity, all previous pregnancies delivered via C-section. . Problem list updated: Yes.  . Influenza vaccine not administered as patient declined, will continue to discuss.   . The patient has the following indication for screening preexisting diabetes: BMI > 25 and high risk ethnicity (Latino, [place] American, Native American, [place] Islander, Asian Naval architect) . Marland Kitchen Anatomy ultrasound ordered to be scheduled at 18-20 weeks. . Patient is not interested in genetic screening. As she is past 13 weeks and 6 days, a Quad screen  was offered.  . Pregnancy education including expected weight gain in pregnancy, OTC medication use, continued use of prenatal vitamin, smoking cessation if applicable, and nutrition in pregnancy.   . Bleeding and pain precautions reviewed. . Follow up 4 weeks.   Peggyann Shoals, DO [facility], PGY-2 04/10/2019 6:43 PM

## 2019-04-10 NOTE — Patient Instructions (Signed)
Segundo trimestre de embarazo Second Trimester of Pregnancy El segundo trimestre va desde la semana14 hasta la 27, desde el cuarto hasta el sexto mes, y suele ser el momento en el que mejor se siente. Su organismo se ha adaptado a estar embarazada, y comienza a sentirse fsicamente mejor. En general, las nuseas matutinas han disminuido o han desaparecido completamente, puede tener ms energa y un aumento de apetito. El segundo trimestre es tambin la poca en la que el feto se desarrolla rpidamente. Hacia el final del sexto mes, el feto mide aproximadamente 9pulgadas (23cm) y pesa alrededor de 1 libras (700g). Es probable que sienta que el beb se mueve (da pataditas) entre las 16 y 20semanas del embarazo. Cambios en el cuerpo durante el segundo trimestre Su cuerpo continua experimentando numerosos cambios durante su segundo trimestre. Estos cambios varan de una mujer a otra.  Seguir aumentando de peso. Notar que la parte baja del abdomen sobresale.  Podrn aparecer las primeras estras en las caderas, el abdomen y las mamas.  Es posible que tenga dolores de cabeza que pueden aliviarse con ciertos medicamentos. Los medicamentos que tome deben estar aprobados por el mdico.  Tal vez tenga necesidad de orinar con ms frecuencia porque el feto est ejerciendo presin sobre la vejiga.  Debido al embarazo podr sentir acidez estomacal con frecuencia.  Puede estar estreida, ya que ciertas hormonas enlentecen los movimientos de los msculos que empujan los desechos a travs de los intestinos.  Pueden aparecer hemorroides o abultarse e hincharse las venas (venas varicosas).  Puede sentir dolor en la espalda. Esto se debe a: ? Aumento de peso. ? Las hormonas del embarazo relajan las articulaciones en la pelvis. ? Un cambio en el peso y los msculos que ayudan a mantener su equilibrio.  Sus pechos seguirn creciendo y se pondrn cada vez ms sensibles.  Las encas pueden sangrar y estar  sensibles al cepillado y al hilo dental.  Pueden aparecer zonas oscuras o manchas (cloasma, mscara del embarazo) en el rostro. Esto probablemente se atenuar despus del nacimiento del beb.  Es posible que se forme una lnea oscura desde el ombligo hasta la zona del pubis (linea nigra). Esto probablemente se atenuar despus del nacimiento del beb.  Tal vez haya cambios en el cabello. Esto cambios pueden incluir su engrosamiento, crecimiento rpido y cambios en la textura. Adems, a algunas mujeres se les cae el cabello durante o despus del embarazo, o tienen el cabello seco o fino. Lo ms probable es que el cabello se le normalice despus del nacimiento del beb. Qu debe esperar en las visitas prenatales Durante una visita prenatal de rutina:  La pesarn para asegurarse de que usted y el feto estn creciendo normalmente.  Le tomarn la presin arterial.  Le medirn el abdomen para controlar el desarrollo del beb.  Se escucharn los latidos cardacos fetales.  Se evaluarn los resultados de los estudios solicitados en visitas anteriores. El mdico puede preguntarle lo siguiente:  Cmo se siente.  Si siente los movimientos del beb.  Si ha tenido sntomas anormales, como prdida de lquido, sangrado, dolores de cabeza intensos o clicos abdominales.  Si est consumiendo algn producto que contenga tabaco, como cigarrillos, tabaco de mascar y cigarrillos electrnicos.  Si tiene alguna pregunta. Otros estudios que podrn realizarse durante el segundo trimestre incluyen lo siguiente:  Anlisis de sangre para detectar lo siguiente: ? Concentraciones de hierro bajas (anemia). ? Nivel alto de azcar en la sangre que afecta a las mujeres embarazadas (diabetes   gestacional) entre las semanas 24 y 28. ? Anticuerpos Rh. Esto es para detectar una protena en los glbulos rojos (factor Rh).  Anlisis de orina para detectar infecciones, diabetes o protenas en la orina.  Una ecografa  para confirmar que el beb crece y se desarrolla correctamente.  Una amniocentesis para diagnosticar posibles problemas genticos.  Estudios del feto para descartar espina bfida y sndrome de Down.  Prueba del VIH (virus de inmunodeficiencia humana). Los exmenes prenatales de rutina incluyen la prueba de deteccin del VIH, a menos que decida no realizrsela. Siga estas indicaciones en su casa: Medicamentos  Siga las indicaciones del mdico en relacin con el uso de medicamentos. Durante el embarazo, hay medicamentos que pueden tomarse y otros que no.  Tome vitaminas prenatales que contengan por lo menos 600microgramos (?g) de cido flico.  Si est estreida, tome un laxante suave, si el mdico lo autoriza. Qu debe comer y beber   Lleve una dieta equilibrada que incluya gran cantidad de frutas y verduras frescas, cereales integrales, buenas fuentes de protenas como carnes magras, huevos o tofu, y lcteos descremados. El mdico la ayudar a determinar la cantidad de peso que puede aumentar.  No coma carne cruda ni quesos sin cocinar. Estos elementos contienen grmenes que pueden causar defectos congnitos en el beb.  Si no consume muchos alimentos con calcio, hable con su mdico sobre si debera tomar un suplemento diario de calcio.  Limite el consumo de alimentos con alto contenido de grasas y azcares procesados, como alimentos fritos o dulces.  Para evitar el estreimiento: ? Bebe suficiente lquido para mantener la orina clara o de color amarillo plido. ? Consuma alimentos ricos en fibra, como frutas y verduras frescas, cereales integrales y frijoles. Actividad  Haga ejercicio solamente como se lo haya indicado el mdico. La mayora de las mujeres pueden continuar su rutina de ejercicios durante el embarazo. Intente realizar como mnimo 30minutos de actividad fsica por lo menos 5das a la semana. Deje de hacer ejercicio si experimenta contracciones uterinas.  No levante  objetos pesados, use zapatos de tacones bajos y mantenga una buena postura.  Puede seguir manteniendo relaciones sexuales, a menos que el mdico le indique lo contrario. Alivio del dolor y del malestar  Use un sostn que le brinde buen soporte para prevenir las molestias causadas por la sensibilidad en los pechos.  Dese baos de asiento con agua tibia para aliviar el dolor o las molestias causadas por las hemorroides. Use una crema para las hemorroides si el mdico la autoriza.  Descanse con las piernas elevadas si tiene calambres o dolor de cintura.  Si tiene venas varicosas, use medias de descanso. Eleve los pies durante 15minutos, 3 o 4veces por da. Limite el consumo de sal en su dieta. Cuidados prenatales  Escriba sus preguntas. Llvelas cuando concurra a las visitas prenatales.  Concurra a todas las visitas prenatales tal como se lo haya indicado el mdico. Esto es importante. Seguridad  Use el cinturn de seguridad en todo momento mientras conduce.  Haga una lista de los nmeros de telfono de emergencia, que incluya los nmeros de telfono de familiares, amigos, el hospital y los departamentos de polica y bomberos. Instrucciones generales  Pdale al mdico que la derive a clases de educacin prenatal en su localidad. Debe comenzar a tomar las clases antes de que empiece el mes6 de embarazo.  Pida ayuda si tiene necesidades nutricionales o de asesoramiento durante el embarazo. El mdico puede aconsejarla o derivarla a especialistas para que la   ayuden con diferentes necesidades.  No se d baos de inmersin en agua caliente, baos turcos ni saunas.  No se haga duchas vaginales ni use tampones o toallas higinicas perfumadas.  No mantenga las piernas cruzadas durante mucho tiempo.  Evite el contacto con las bandejas sanitarias de los gatos y la tierra que estos animales usan. Estos elementos contienen bacterias que pueden causar defectos congnitos al beb y la posible  prdida del feto debido a un aborto espontneo o muerte fetal.  Evite fumar, consumir hierbas, beber alcohol y tomar frmacos que no le hayan recetado. Las sustancias qumicas que estos productos contienen pueden afectar la formacin y el desarrollo del beb.  No consuma ningn producto que contenga nicotina o tabaco, como cigarrillos y cigarrillos electrnicos. Si necesita ayuda para dejar de fumar, consulte al mdico.  Visite a su dentista si an no lo ha hecho durante el embarazo. Use un cepillo de dientes blando para higienizarse los dientes y psese el hilo dental con suavidad. Comunquese con un mdico si:  Tiene mareos.  Siente clicos leves, presin en la pelvis o dolor persistente en el abdomen.  Tiene nuseas, vmitos o diarrea persistentes.  Observa una secrecin vaginal con mal olor.  Siente dolor al orinar. Solicite ayuda de inmediato si:  Tiene fiebre.  Tiene una prdida de lquido por la vagina.  Tiene sangrado o pequeas prdidas vaginales.  Siente dolor intenso o clicos en el abdomen.  Sube de peso o baja de peso rpidamente.  Tiene dificultad para respirar y siente dolor de pecho.  Sbitamente se le hinchan mucho el rostro, las manos, los tobillos, los pies o las piernas.  No ha sentido los movimientos del beb durante una hora.  Siente un dolor de cabeza intenso que no se alivia al tomar medicamentos.  Nota cambios en la visin. Resumen  El segundo trimestre va desde la semana14 hasta la 27, desde el cuarto hasta el sexto mes. Es tambin una poca en la que el feto se desarrolla rpidamente.  Su organismo atraviesa por muchos cambios durante el embarazo. Estos cambios varan de una mujer a otra.  Evite fumar, consumir hierbas, beber alcohol y tomar frmacos que no le hayan recetado. Estas sustancias qumicas afectan la formacin y el desarrollo de su beb.  No consuma ningn producto que contenga tabaco, lo que incluye cigarrillos, tabaco de mascar  y cigarrillos electrnicos. Si necesita ayuda para dejar de fumar, consulte al mdico.  Comunquese con su mdico si tiene preguntas sobre esto. Concurra a todas las visitas prenatales tal como se lo haya indicado el mdico. Esto es importante. Esta informacin no tiene como fin reemplazar el consejo del mdico. Asegrese de hacerle al mdico cualquier pregunta que tenga. Document Revised: 06/05/2016 Document Reviewed: 06/05/2016 Elsevier Patient Education  2020 Elsevier Inc.  

## 2019-04-14 ENCOUNTER — Telehealth: Payer: Self-pay

## 2019-04-14 NOTE — Telephone Encounter (Signed)
Patient calling nurse line regarding cramping abdominal pain. Patient is approx. [redacted] weeks pregnant. Patient reports that pain started at around 0300. Pain is intermittent in nature and rated at 5/10. States that pain feels similar to contraction. Patient also reports vomiting. Pt denies bleeding or loss of fluid.   No available afternoon appointments. Advised patient to be evaluated in maternity assessment unit.   Patient verbalizes understanding  To PCP  Veronda Prude, RN

## 2019-04-22 ENCOUNTER — Other Ambulatory Visit: Payer: Self-pay

## 2019-04-22 DIAGNOSIS — Z3481 Encounter for supervision of other normal pregnancy, first trimester: Secondary | ICD-10-CM

## 2019-04-23 LAB — GLUCOSE TOLERANCE, 1 HOUR: Glucose, 1Hr PP: 129 mg/dL (ref 65–199)

## 2019-04-30 ENCOUNTER — Ambulatory Visit (INDEPENDENT_AMBULATORY_CARE_PROVIDER_SITE_OTHER): Payer: Self-pay | Admitting: Family Medicine

## 2019-04-30 ENCOUNTER — Other Ambulatory Visit: Payer: Self-pay

## 2019-04-30 VITALS — BP 98/60 | HR 87 | Wt 215.0 lb

## 2019-04-30 DIAGNOSIS — R252 Cramp and spasm: Secondary | ICD-10-CM

## 2019-04-30 DIAGNOSIS — R109 Unspecified abdominal pain: Secondary | ICD-10-CM

## 2019-04-30 DIAGNOSIS — Z3482 Encounter for supervision of other normal pregnancy, second trimester: Secondary | ICD-10-CM

## 2019-04-30 DIAGNOSIS — O26892 Other specified pregnancy related conditions, second trimester: Secondary | ICD-10-CM

## 2019-04-30 MED ORDER — CYCLOBENZAPRINE HCL 5 MG PO TABS: 5.0000 mg | ORAL_TABLET | Freq: Two times a day (BID) | ORAL | 0 refills | Status: DC | PRN

## 2019-04-30 NOTE — Progress Notes (Signed)
o2 - 98%

## 2019-04-30 NOTE — Patient Instructions (Signed)
It was wonderful to see you today.  To help with your belly pains, you can take Tylenol 500 mg every 4-6 hours.  You can also use a heating pad up to 20 minutes at a time on your back or the side of your belly.  You may also purchase a pregnancy binder, this goes under your belly to help lift this area up.  Please make sure that you are trying to walk and stay active as you can so that your muscle stay strong.  If you have any vaginal bleeding, leakage of fluid, severe abdominal pain not controlled with pain meds, or persistent nausea and vomiting you need to go to the MAU and seek care.  Please let me know if your belly pain is not improving with these measures and make sure you follow-up if so.  We will make sure that we get you scheduled in the next week or so for your anatomy scan.

## 2019-04-30 NOTE — Progress Notes (Signed)
    SUBJECTIVE:   CHIEF COMPLAINT / HPI: Abdominal pain  Autumn Johnson is a 29 year old [id number] female currently at [redacted]w[redacted]d gestation, dated by early U/S, presenting today for evaluation of abdominal pain.   Spanish interpreter via iPad was used for duration of the visit.  Abdominal pain: Reports this has been present for the past several weeks, has not increased in severity since onset.  Reports it feels like cramping that starts on her right side and goes to her upper and largest portion of abdomen.  She reports she always has a little discomfort in her abdomen and feels stiff, worse with standing long periods of time and sitting completely upright. Keeping her awake at night sometimes.  She feels better with bending forward. No associated vaginal bleeding, leakage of fluid, SOB, lightheadedness/dizziness, CP, constipation/diarrhea, or lack of appetite.  Has heartburn on occasion, but does not happen at the same time.  Has occasional nausea and vomiting that has significantly improved since first trimester, sometimes pain will spasm and will make her nauseous. Has not tried anything for discomfort.   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  PERTINENT  PMH / PSH: Language barrier, currently pregnant, history of C-section with failed VBACs  OBJECTIVE:   BP 98/60   Pulse 87   Wt 215 lb (97.5 kg)   LMP 11/16/2018 (Approximate)   BMI 42.77 kg/m   FHT: 145 bpm  Fundal height: Can palpate uterus below umbilicus  Gen: well appearing, NAD Lungs: Unlabored breathing  Abdomen: gravid uterus, non-tender to palpation, normoactive BS   ASSESSMENT/PLAN:   Abdominal pain during pregnancy in second trimester Subacute, suspect muscle strains in the setting of expanding uterus/abdomen given clinical picture.  Reassuringly appropriate FHT without red flags to history (including no leakage of fluid or vaginal bleeding).  Discussed using Tylenol PRN, heating  pads on flank/back, and low dose Flexeril as needed.  Recommended pregnancy binder to help support growing belly.  Strict return precautions discussed including persistent/severe abdominal cramping, vaginal bleeding, leakage of fluid, severe nausea/vomiting etc.   Supervision of normal pregnancy Placed order for anatomy scan, will fax to health department to get this scheduled between 18-20 weeks. Follow-up for prenatal care on 4/23 with Dr. Dareen Piano, should schedule with [facility] faculty for her next appointment after that.  Additionally will need to meet with Dr. Shawnie Pons to discuss repeat C-section.    Case discussed with Dr. Lum Babe. Follow-up if not improving or worsening as discussed above, otherwise follow-up for routine prenatal care.  Allayne Stack, DO [facility]

## 2019-05-01 ENCOUNTER — Encounter: Payer: Self-pay | Admitting: Family Medicine

## 2019-05-01 DIAGNOSIS — R109 Unspecified abdominal pain: Secondary | ICD-10-CM | POA: Insufficient documentation

## 2019-05-01 DIAGNOSIS — O26892 Other specified pregnancy related conditions, second trimester: Secondary | ICD-10-CM | POA: Insufficient documentation

## 2019-05-01 NOTE — Assessment & Plan Note (Signed)
Subacute, suspect muscle strains in the setting of expanding uterus/abdomen given clinical picture.  Reassuringly appropriate FHT without red flags to history (including no leakage of fluid or vaginal bleeding).  Discussed using Tylenol PRN, heating pads on flank/back, and low dose Flexeril as needed.  Recommended pregnancy binder to help support growing belly.  Strict return precautions discussed including persistent/severe abdominal cramping, vaginal bleeding, leakage of fluid, severe nausea/vomiting etc.

## 2019-05-01 NOTE — Assessment & Plan Note (Addendum)
Placed order for anatomy scan, will fax to health department to get this scheduled between 18-20 weeks. Follow-up for prenatal care on 4/23 with Dr. Dareen Piano, should schedule with [facility] faculty for her next appointment after that.  Additionally will need to meet with Dr. Shawnie Pons to discuss repeat C-section.

## 2019-05-06 ENCOUNTER — Other Ambulatory Visit: Payer: Self-pay | Admitting: Family Medicine

## 2019-05-06 NOTE — Progress Notes (Signed)
Corrected dating from documented [redacted]w[redacted]d in epic to correct [redacted]w[redacted]d as reported at the time of early ultrasound scanned into media.   This transitions her current gestation to 19w from [redacted]w[redacted]d.   Autumn Stack, DO

## 2019-05-26 ENCOUNTER — Encounter: Payer: Self-pay | Admitting: Family Medicine

## 2019-05-29 NOTE — Progress Notes (Signed)
  [facility] Prenatal Visit  Autumn Johnson is a 29 y.o. [phone] at [redacted]w[redacted]d here for routine follow up. She is dated by [redacted]w[redacted]d Ultrasound.  She reports no complaints.  She reports good fetal movement. No bleeding, loss of fluid, contractions. See flow sheet for details.  Vitals:   05/30/19 1031  BP: 104/62  Pulse: 86   Translator Julian #[id number]  A/P: Pregnancy at [redacted]w[redacted]d.  Doing well.   . Dating reviewed, dating tab is correct . Fetal heart tones Appropriate, 151 bpm . Fundal height >2 cm from expected size given dating, discussed with preceptor. PATIENT HAS A PANNUS! From naval measures 20cm, from mons pubis 34cm (charted) . Pregnancy issues include: occasional abdominal pain, obesity, Hx C-section. . Anatomy ultrasound reviewed and notable for female gender, normal anatomy.  . Problem list updated Yes.  . Influenza vaccine not administered as not influenza season. Patient is adopt-a-mom and would have to get vaccine from Health Department  . Indications for screening for preexisting diabetes include: BMI > 25 and high risk ethnicity (Latino, [place] American, Native American, [place] Islander, Asian Naval architect) . Was normal.  . Pregnancy education provided on the following topics: fetal growth and movement, ultrasound assessment, and upcoming laboratory assessment.   . Scheduled for Faculty Ob Clinic during second trimester on 06/26/2019. . Preterm labor precautions given.   Follow up 4 weeks.   Peggyann Shoals, DO [facility], PGY-2 05/30/2019 11:05 AM

## 2019-05-30 ENCOUNTER — Other Ambulatory Visit: Payer: Self-pay

## 2019-05-30 ENCOUNTER — Ambulatory Visit (INDEPENDENT_AMBULATORY_CARE_PROVIDER_SITE_OTHER): Payer: Self-pay | Admitting: Family Medicine

## 2019-05-30 VITALS — BP 104/62 | HR 86 | Wt 217.2 lb

## 2019-05-30 DIAGNOSIS — Z3482 Encounter for supervision of other normal pregnancy, second trimester: Secondary | ICD-10-CM

## 2019-05-30 NOTE — Patient Instructions (Addendum)
Zyrtec/Cetirizine 10mg  every day for Allergies.   Next appointment: Jun 26, 2019 at 9:30am  Ciruga para evitar Jun 28, 2019 Surgery to Prevent Pregnancy La esterilizacin en la mujer es una ciruga que se realiza para Firefighter. En esta ciruga, las trompas de [place] se obstruyen o se [place]. Cuando las trompas de Music therapist se [employer], los vulos que Music therapist ovarios no pueden ingresar al tero, los espermatozoides no [employer] a los vulos y usted no puede quedar [place]. La esterilizacin es [place]. Solo debe hacerse si est segura de que no desea tener hijos. Cules son las opciones quirrgicas para la esterilizacin? Hay varias clases de cirugas para esterilizacin en la mujer. Incluyen los siguientes:  Ligadura laparoscpica de trompas. En esta ciruga, las trompas de [place] se atan, se sellan con calor o se obstruyen con un clip, un anillo o una abrazadera. Tambin puede extraerse una parte pequea de cada trompa de [place]. Esta ciruga se realiza a travs de varios cortes pequeos (incisiones) con instrumentos especiales que se introducen en el abdomen.  Ligadura de trompas posparto. Esto tambin se denomina minilaparotoma. La ciruga se realiza de inmediato despus del nacimiento, o 1o 2das despus del nacimiento. En esta ciruga, las trompas de Nordstrom se atan, se sellan con calor o se obstruyen con un clip, un anillo o una abrazadera. Tambin puede extraerse una parte pequea de cada trompa de [place]. La ciruga se realiza a travs de una nica incisin en el abdomen.  Ligadura de trompas durante una cesrea. En esta ciruga, las trompas de Nordstrom se atan, se sellan con calor o se obstruyen con un clip, un anillo o una abrazadera. Tambin puede extraerse una parte pequea de cada trompa de Falopio. La ciruga se realiza en el mismo momento que un parto por cesrea. La esterilizacin es un procedimiento seguro? En general, la esterilizacin es segura. Las  complicaciones son raras. No obstante, hay algunos riesgos. Incluyen los siguientes:  Sangrado.  Infeccin.  Reaccin al medicamento utilizado durante el procedimiento.  Lesin en los rganos circundantes.  Fallas en el procedimiento. La esterilizacin es un mtodo efectivo? La esterilizacin es efectiva en casi 100%, pero puede fallar. En casos raros, con Nordstrom, las trompas de [employer] pueden volver a [employer]strom. Si esto sucede, puede ser posible el embarazo y podr quedar embarazada nuevamente. Los mujeres que se realizan este procedimiento tienen ms probabilidades de sufrir un Engineer, building services ectpico. El embarazo ectpico es aquel que se produce fuera del tero. Esta clase de embarazo puede causar sangrado grave si no se trata. Cules son los beneficios?  En general, es efectiva para toda la vida.  Suele ser segura.  No tiene los inconvenientes de otros tipos de mtodos anticonceptivos porque no afecta las hormonas. Por lo tanto, no influye en los perodos menstruales, en el deseo ni en el rendimiento sexual. Cules son los inconvenientes?  Deber recuperarse y puede tener complicaciones despus de la Psychiatrist.  Si cambia de [place] y decide que quiere tener hijos, no podr Lawyer. La esterilizacin puede revertirse, pero esto no siempre es exitoso.  No ofrece proteccin contra las ETS (enfermedades de transmisin sexual).  Aumenta la probabilidad de tener un embarazo ectpico. Siga estas instrucciones en su casa:  Concurra a todas las visitas de seguimiento como se lo haya indicado el mdico. Esto es importante. Resumen  La esterilizacin en la mujer es una ciruga que se realiza para Media planner.  Hay diferentes clases de cirugas de esterilizacin femenina.  La esterilizacin  puede revertirse, pero esto no siempre es exitoso.  La esterilizacin no protege contra las enfermedades de transmisin sexual (ETS). Esta informacin no tiene Marine scientist el  consejo del mdico. Asegrese de hacerle al mdico cualquier pregunta que tenga. Document Revised: 11/23/2017 Document Reviewed: 11/23/2017 Elsevier Patient Education  2020 [employer].

## 2019-06-23 ENCOUNTER — Other Ambulatory Visit: Payer: Self-pay | Admitting: Family Medicine

## 2019-06-26 ENCOUNTER — Ambulatory Visit (INDEPENDENT_AMBULATORY_CARE_PROVIDER_SITE_OTHER): Payer: Self-pay | Admitting: Family Medicine

## 2019-06-26 ENCOUNTER — Encounter: Payer: Self-pay | Admitting: Family Medicine

## 2019-06-26 ENCOUNTER — Other Ambulatory Visit: Payer: Self-pay

## 2019-06-26 VITALS — BP 102/62 | HR 86 | Wt 219.4 lb

## 2019-06-26 DIAGNOSIS — Z3480 Encounter for supervision of other normal pregnancy, unspecified trimester: Secondary | ICD-10-CM

## 2019-06-26 DIAGNOSIS — O99210 Obesity complicating pregnancy, unspecified trimester: Secondary | ICD-10-CM | POA: Insufficient documentation

## 2019-06-26 DIAGNOSIS — O99212 Obesity complicating pregnancy, second trimester: Secondary | ICD-10-CM

## 2019-06-26 LAB — POCT 1 HR PRENATAL GLUCOSE: Glucose 1 Hr Prenatal, POC: 127 mg/dL

## 2019-06-26 NOTE — Progress Notes (Addendum)
  [facility] Prenatal Visit  The patient speaks Spanish as their primary language.  An interpreter was used for the entire visit.    Autumn Johnson is a 29 y.o. [id number] at [redacted]w[redacted]d here for routine follow up. She is dated by early ultrasound.  She reports backache on occasion with no other complaints at this time and no back pain today.  Back pain occurs after sitting for long periods. No incontinence, numbness, weakness. She reports fetal movement. Denies vaginal bleeding, loss of fluid, or contractions.  See flow sheet for details.  Patient states she is still unsure if she would like to have a circumcision once baby is born but she does plan to breast-feed.  Vitals:   06/26/19 0949  BP: 102/62  Pulse: 86     A/P: Pregnancy at [redacted]w[redacted]d.  Doing well.   . Dating reviewed, dating tab is correct . Fetal heart tones Appropriate . Fundal height >2 cm from expected size given dating, discussed with preceptor.  Per discussion with OB fellow  should repeat ultrasound every 4-6 weeks due to difficulty in fundal height measurement. Consider beginning around 28-30 weeks . Pregnancy issues include: o Obesity affecting pregnancy, normal 1 hour today. Discussed proper weight gain.  o History of Cesarean delivery, referral placed for consultation.  . Problem list updated Yes.   . Influenza vaccine not administered as not influenza season.   . Screening for gestational diabetes completed today and appropriate.  . Pregnancy education completed including: fetal growth, breastfeeding, contraception, and expected weight gain in pregnancy.   . The patient has a history of Cesarean delivery and consult to [facility] placed for discussion, signing of consents, and scheduling delivery.  . Scheduled for follow up during second trimester on 07/11/19. . Preterm labor, bleeding, and pain precautions given.   Follow up 2 weeks.   Discussed reason for consultation with Obstetrics.  Discussed need for future growth scans, will need ordered through [facility] [facility] [employer] at followup. Letter given for Tdap. Patient seen with Dr. Atha Starks. I discussed the plan of care with the resident physician and agree with below documentation.  Terisa Starr, MD

## 2019-06-26 NOTE — Patient Instructions (Addendum)
It was wonderful to see you today.  Please bring ALL of your medications with you to every visit.   Thank you for choosing [facility].   Please call [phone] with any questions about today's appointment.  Please be sure to schedule follow up at the front  desk before you leave today.   Terisa Starr, MD  Family Medicine   Can use tylenol or heating pads for back pain if needed.  Please call [facility] at [phone] to discuss scheduling repeat c/section. They should be reaching out to you within the next 2 weeks.   We have a follow up appointment scheduled with Dr. Talbert Forest on Friday June 4th at 8:30am.

## 2019-06-26 NOTE — Assessment & Plan Note (Signed)
Plan: -Per OB fellow should repeat ultrasound every 4-6 weeks due to difficulty in fundal height measurement.

## 2019-06-27 LAB — CBC
Hematocrit: 34.4 % (ref 34.0–46.6)
Hemoglobin: 11.9 g/dL (ref 11.1–15.9)
MCH: 32.1 pg (ref 26.6–33.0)
MCHC: 34.6 g/dL (ref 31.5–35.7)
MCV: 93 fL (ref 79–97)
Platelets: 220 10*3/uL (ref 150–450)
RBC: 3.71 x10E6/uL — ABNORMAL LOW (ref 3.77–5.28)
RDW: 12.8 % (ref 11.7–15.4)
WBC: 7.7 10*3/uL (ref 3.4–10.8)

## 2019-06-27 LAB — HIV ANTIBODY (ROUTINE TESTING W REFLEX): HIV Screen 4th Generation wRfx: NONREACTIVE

## 2019-06-27 LAB — RPR: RPR Ser Ql: NONREACTIVE

## 2019-07-01 ENCOUNTER — Encounter: Payer: Self-pay | Admitting: Family Medicine

## 2019-07-01 ENCOUNTER — Telehealth: Payer: Self-pay | Admitting: Family Medicine

## 2019-07-01 NOTE — Telephone Encounter (Signed)
The patient speaks Spanish as their primary language.  An interpreter was used for the entire visit.    Attempted to call with result-- unable leave voicemail. If patient calls back, all results normal.

## 2019-07-11 ENCOUNTER — Other Ambulatory Visit: Payer: Self-pay

## 2019-07-11 ENCOUNTER — Ambulatory Visit (INDEPENDENT_AMBULATORY_CARE_PROVIDER_SITE_OTHER): Payer: Self-pay | Admitting: Family Medicine

## 2019-07-11 VITALS — BP 100/60 | HR 74 | Wt 219.0 lb

## 2019-07-11 DIAGNOSIS — Z3482 Encounter for supervision of other normal pregnancy, second trimester: Secondary | ICD-10-CM

## 2019-07-11 DIAGNOSIS — E669 Obesity, unspecified: Secondary | ICD-10-CM

## 2019-07-11 DIAGNOSIS — L299 Pruritus, unspecified: Secondary | ICD-10-CM

## 2019-07-11 NOTE — Patient Instructions (Signed)
Thank you for coming to see me today. It was a pleasure! Today we talked about:   If you have any contractions which occur 7-10 minutes apart, vaginal bleeding, fluid leaking, or are worried that baby is not moving well, go immediately to [facility] to be evaluated.  Please follow-up with [place] in 2 weeks or sooner as needed.  If you have any questions or concerns, please do not hesitate to call the office at [phone].  Take Care,   Autumn Johnson Evalin Shawhan, DO  Tercer trimestre de Psychiatrist Third Trimester of Pregnancy  El tercer trimestre comprende desde la [employer] la semana40 (desde el mes7 hasta el mes9). En este trimestre, el beb en gestacin (feto) crece muy rpidamente. Hacia el final del noveno mes, el beb en gestacin mide alrededor de 20pulgadas (45cm) de largo. Pesa entre 6y 10libras [phone]). Siga estas indicaciones en su casa: Medicamentos  [employer] de venta libre y los recetados solamente como se lo haya indicado el mdico. Algunos medicamentos son seguros para tomar durante el Psychiatrist y otros no lo son.  Tome vitaminas prenatales que contengan por lo menos (?g) de cido flico.  Si tiene dificultad para mover el intestino (estreimiento), tome un medicamento para ablandar las heces (laxante) si su mdico se lo autoriza. Comida y bebida   Ingiera alimentos saludables de [place] regular.  No coma carne cruda ni quesos sin cocinar.  Si obtiene poca cantidad de calcio de los alimentos que ingiere, consulte a su mdico sobre la posibilidad de tomar un suplemento diario de calcio.  La ingesta diaria de cuatro o cinco comidas pequeas en lugar de tres comidas abundantes.  Evite el consumo de alimentos ricos en grasas y azcares, como los alimentos fritos y los dulces.  Para evitar el estreimiento: ? Consuma alimentos ricos en fibra, como frutas y verduras frescas, cereales integrales y frijoles. ? Beba suficiente  lquido para mantener el pis (orina) claro o de color amarillo plido. Actividad  Haga ejercicios solamente como se lo haya indicado el mdico. Interrumpa la actividad fsica si comienza a tener calambres.  No levante objetos pesados, use zapatos de tacones bajos y sintese derecha.  No haga ejercicio si hace demasiado calor, hay demasiada humedad o se encuentra en un lugar de mucha altura (altitud alta).  Puede continuar teniendo [employer], a menos que el mdico le indique lo contrario. Alivio del dolor y del Dentist  Use un sostn que le brinde buen soporte si sus mamas estn sensibles.  Haga pausas frecuentes y descanse con las piernas levantadas si tiene calambres en las piernas o dolor en la zona lumbar.  Dese baos de asiento con agua tibia para Engineer, materials o las molestias causadas por las hemorroides. Use una crema para las hemorroides si el mdico la autoriza.  Si desarrolla venas hinchadas y abultadas (vrices) en las piernas: ? Use medias de compresin o medias de descanso como se lo haya indicado el mdico. ? Levante (eleve) los pies durante , 3 o 4veces por Futures trader. ? Limite el consumo de sal en sus alimentos. Seguridad  [employer] cinturn de seguridad cuando conduzca.  Haga una lista de los nmeros de telfono de Associate Professor, que [employer] nmeros de telfono de familiares, amigos, [facility], as como los departamentos de polica y bomberos. Preparacin para la llegada del beb Para prepararse para la llegada de su beb:  Tome clases prenatales.  Practique ir [employer] al hospital.  [facility] y Trecia Rogers  el rea de maternidad.  Hable en su trabajo acerca de tomar licencia cuando llegue el beb.  Prepare el bolso que llevar al hospital.  Prepare la habitacin del beb.  Concurra a los controles mdicos.  Compre un asiento de seguridad [employer] atrs para llevar al beb en el automvil. Aprenda cmo instalarlo en el  auto. Instrucciones generales  No se d baos de inmersin en agua caliente, baos turcos ni saunas.  No consuma ningn producto que contenga nicotina o tabaco, como cigarrillos y Psychologist, sport and exercise. Si necesita ayuda para dejar de fumar, consulte al mdico.  No beba alcohol.  No se haga duchas vaginales ni use tampones o toallas higinicas perfumadas.  No mantenga las piernas cruzadas durante mucho tiempo.  No haga viajes de larga distancia, excepto si es obligatorio. Hgalos solamente si su mdico la autoriza.  Visite a su dentista si no lo ha Quarry manager. Use un cepillo de cerdas suaves para cepillarse los dientes. Psese el hilo dental con suavidad.  Evite el contacto con las bandejas sanitarias de los gatos y la tierra que estos animales usan. Estos elementos contienen bacterias que pueden causar defectos congnitos al beb y la posible prdida del beb (aborto espontneo) o la muerte fetal.  Concurra a todas las visitas prenatales como se lo haya indicado el mdico. Esto es importante. Comunquese con un mdico si:  No est segura de si est en trabajo de parto o si ha roto la bolsa de las aguas.  Tiene mareos.  Tiene clicos leves o siente presin en la parte baja del vientre.  Sufre un dolor persistente en el abdomen.  Sigue teniendo [employer], vomita o tiene heces lquidas.  Advierte un lquido con olor ftido que proviene de la vagina.  Siente dolor al [employer]. Solicite ayuda de inmediato si:  Tiene fiebre.  Tiene una prdida de lquido por la vagina.  Tiene sangrado o pequeas prdidas vaginales.  Siente dolor intenso o clicos en el abdomen.  Aumenta o baja de peso rpidamente.  Tiene dificultades para recuperar el aliento y siente dolor en el pecho.  Sbitamente se le hinchan mucho el rostro, las [place], los tobillos, los pies o las piernas.  No ha sentido los movimientos del beb durante Leone Brand.  Siente un dolor de cabeza  intenso que no se alivia con medicamentos.  Tiene dificultad para ver.  Tiene prdida de lquido o Chief Financial Officer chorro de lquido de la vagina antes de estar en la semana 37.  Tiene espasmos abdominales (contracciones) regulares antes de estar en la semana 50. Resumen  El tercer trimestre comprende desde la [employer] la [id number] (desde el mes7 hasta el mes9). Esta es la poca en que el beb en gestacin crece muy rpidamente.  Siga los consejos del mdico con respecto a los medicamentos, la alimentacin y [place].  Preprese para la llegada del beb tomando las clases prenatales, preparando todo lo que necesitar el beb, arreglando la habitacin del beb y concurriendo a los controles mdicos.  Solicite ayuda de inmediato si tiene sangrado por la vagina, siente dolor en el pecho o tiene dificultad para respirar, o si no ha sentido que su beb se mueve en el transcurso de ms de [employer] Corporation. Esta informacin no tiene Marine scientist el consejo del mdico. Asegrese de hacerle al mdico cualquier pregunta que tenga. Document Revised: 08/28/2016 Document Reviewed: 08/28/2016 Elsevier Patient Education  [place].

## 2019-07-11 NOTE — Progress Notes (Signed)
  [facility] Prenatal Visit  Autumn Johnson is a 29 y.o. [phone] at [redacted]w[redacted]d here for routine follow up. She is dated by early ultrasound.  She reports no bleeding, no contractions, no cramping, no leaking and Patient reports that yesterday she started having diffuse itching all day.  She states that it only subsided later in the evening enough to allow her to go to sleep and has periodically been happening today but she is not currently having any itching.  She denies any rashes.  She reports she has never had this happen any previous pregnancies.  She denies any RUQ pain.  She denies any extremity swelling.. She reports fetal movement. She denies vaginal bleeding, contractions, or loss of fluid. See flow sheet for details.  Vitals:   07/11/19 0847  BP: 100/60  Pulse: 74   Fetal heart rate 132 bpm Fundal height 34 cm   A/P: Pregnancy at 109w3d.  Doing well.   1. Routine prenatal care:  Marland Kitchen Dating reviewed, dating tab is correct . Fetal heart tones Appropriate . Fundal height >2 cm from expected size given dating, discussed with preceptor.  Patient to begin having regular ultrasounds for discrepancy in size every 4 to 6 weeks per last recommendation. . Infant feeding choice: Both  . Contraception choice: Tubal ligation, forms to be signed at 32 weeks  . Infant circumcision desired undecided  . The patient has a history of Cesarean delivery and consult to [facility] placed for discussion, signing of consents, and scheduling delivery.  . Influenza vaccine not administered as not influenza season.   . Tdap was not given today.  Patient completed Tdap at health department. . 1 hour glucola previously normal . Pregnancy education regarding benefits of breastfeeding, contraception, fetal growth, expected weight gain, and safe infant sleep were discussed.  . Preterm labor and fetal movement precautions reviewed.  2. Pregnancy issues include the following and were  addressed as appropriate today:  . Pruritus: Patient with diffuse pruritus.  Has subsided and started yesterday.  No other symptoms present.  Will obtain CMP with bile acids in order to rule out cholestasis of pregnancy.  Strict return precautions discussed and patient voiced understanding. . Problem list and pregnancy box updated: Yes.   Patient scheduled with pregnancy PCP Dr. Dareen Piano on 07/29/2019 and will need faculty OB clinic scheduled during third trimester after appointment.  Follow up 2 weeks.  [place] Kenlie Seki, DO PGY-3, [facility]

## 2019-07-13 LAB — COMPREHENSIVE METABOLIC PANEL
ALT: 13 IU/L (ref 0–32)
AST: 13 IU/L (ref 0–40)
Albumin/Globulin Ratio: 1.3 (ref 1.2–2.2)
Albumin: 3.6 g/dL — ABNORMAL LOW (ref 3.9–5.0)
Alkaline Phosphatase: 83 IU/L (ref 48–121)
BUN/Creatinine Ratio: 20 (ref 9–23)
BUN: 10 mg/dL (ref 6–20)
Bilirubin Total: 0.2 mg/dL (ref 0.0–1.2)
CO2: 17 mmol/L — ABNORMAL LOW (ref 20–29)
Calcium: 8.9 mg/dL (ref 8.7–10.2)
Chloride: 104 mmol/L (ref 96–106)
Creatinine, Ser: 0.51 mg/dL — ABNORMAL LOW (ref 0.57–1.00)
GFR calc Af Amer: 149 mL/min/{1.73_m2} (ref >59)
GFR calc non Af Amer: 129 mL/min/{1.73_m2} (ref >59)
Globulin, Total: 2.7 g/dL (ref 1.5–4.5)
Glucose: 84 mg/dL (ref 65–99)
Potassium: 3.7 mmol/L (ref 3.5–5.2)
Sodium: 134 mmol/L (ref 134–144)
Total Protein: 6.3 g/dL (ref 6.0–8.5)

## 2019-07-13 LAB — CBC
Hematocrit: 33.8 % — ABNORMAL LOW (ref 34.0–46.6)
Hemoglobin: 11.4 g/dL (ref 11.1–15.9)
MCH: 32 pg (ref 26.6–33.0)
MCHC: 33.7 g/dL (ref 31.5–35.7)
MCV: 95 fL (ref 79–97)
Platelets: 194 10*3/uL (ref 150–450)
RBC: 3.56 x10E6/uL — ABNORMAL LOW (ref 3.77–5.28)
RDW: 13.3 % (ref 11.7–15.4)
WBC: 6.7 10*3/uL (ref 3.4–10.8)

## 2019-07-13 LAB — BILE ACIDS, TOTAL: Bile Acids Total: 1.5 umol/L (ref 0.0–10.0)

## 2019-07-15 ENCOUNTER — Encounter: Payer: Self-pay | Admitting: Family Medicine

## 2019-07-21 ENCOUNTER — Encounter: Payer: Self-pay | Admitting: Family Medicine

## 2019-07-29 ENCOUNTER — Ambulatory Visit (INDEPENDENT_AMBULATORY_CARE_PROVIDER_SITE_OTHER): Payer: Self-pay | Admitting: Family Medicine

## 2019-07-29 ENCOUNTER — Other Ambulatory Visit: Payer: Self-pay

## 2019-07-29 VITALS — BP 108/60 | HR 77 | Wt 218.0 lb

## 2019-07-29 DIAGNOSIS — Z3482 Encounter for supervision of other normal pregnancy, second trimester: Secondary | ICD-10-CM

## 2019-07-29 NOTE — Progress Notes (Signed)
  [facility] Prenatal Visit  Autumn Johnson is a 29 y.o. [phone] at [redacted]w[redacted]d here for routine follow up. She is dated by early ultrasound.  She reports no complaints.  She reports fetal movement. She denies vaginal bleeding, contractions, or loss of fluid.  See flow sheet for details.  Vitals:   07/29/19 1431  BP: 108/60  Pulse: 77     A/P: Pregnancy at [redacted]w[redacted]d.  Doing well.   1. Routine prenatal care:  Marland Kitchen Dating reviewed, dating tab is correct . Fetal heart tones: Appropriate, 130 . Fundal height: >2 cm from expected size given dating, discussed with preceptor.  - 40cm from mons pubis, 21cm from [employer]; discussed with preceptor. Patient is also followed by OBGYN outpatient, is planning for C-section at 39 weeks. . The patient does not have a history of HSV and valacyclovir is not indicated at this time.  . The patient has a history of Cesarean delivery and consult to [facility] placed for discussion, signing of consents, and scheduling delivery.  . Infant feeding choice: Both  . Contraception choice: Tubal ligation, forms to be signed at 32 weeks  - 07/31/2019 . Infant circumcision desired: patient is unsure at this time . Influenza vaccine not administered as not influenza season.   . Tdap was given - 3 weeks ago . Childbirth and education classes were offered. . Pregnancy education regarding benefits of breastfeeding, contraception, fetal growth, expected weight gain, and safe infant sleep were discussed.  . Preterm labor and fetal movement precautions reviewed.   2. Pregnancy issues include the following and were addressed as appropriate today: . High risk pregnancy, history of 3 cesarean deliveries, fundal height/infant size not compatible with dates . Problem list and pregnancy box updated: Yes.    Scheduled for Ob Faculty clinic in third trimester - NOT YET SCHEDULED. Plan for appt in July 2021.  Follow up 2 weeks.   Peggyann Shoals, DO [facility]  Health Family Medicine, PGY-2 08/01/2019 6:06 PM

## 2019-07-31 ENCOUNTER — Other Ambulatory Visit: Payer: Self-pay

## 2019-07-31 ENCOUNTER — Encounter: Payer: Self-pay | Admitting: Obstetrics & Gynecology

## 2019-07-31 ENCOUNTER — Ambulatory Visit (INDEPENDENT_AMBULATORY_CARE_PROVIDER_SITE_OTHER): Payer: Self-pay | Admitting: Obstetrics & Gynecology

## 2019-07-31 VITALS — BP 105/65 | HR 87 | Wt 216.9 lb

## 2019-07-31 DIAGNOSIS — Z3A31 31 weeks gestation of pregnancy: Secondary | ICD-10-CM

## 2019-07-31 DIAGNOSIS — N85A Isthmocele: Secondary | ICD-10-CM

## 2019-07-31 DIAGNOSIS — E669 Obesity, unspecified: Secondary | ICD-10-CM

## 2019-07-31 DIAGNOSIS — Z3483 Encounter for supervision of other normal pregnancy, third trimester: Secondary | ICD-10-CM

## 2019-07-31 DIAGNOSIS — O34219 Maternal care for unspecified type scar from previous cesarean delivery: Secondary | ICD-10-CM

## 2019-07-31 DIAGNOSIS — O99213 Obesity complicating pregnancy, third trimester: Secondary | ICD-10-CM

## 2019-07-31 NOTE — Progress Notes (Signed)
   PRENATAL VISIT NOTE  Subjective:  Autumn Johnson is a 29 y.o. [id number] at [redacted]w[redacted]d being seen today for cesarean section consult.  She is currently monitored for the following issues for this high-risk pregnancy and has Status post repeat low transverse cesarean section; Language barrier, cultural differences; Obesity; Supervision of normal pregnancy; and Obesity affecting pregnancy on their problem list.  Patient reports no bleeding, no contractions, no cramping and no leaking.  Contractions: Irritability. Vag. Bleeding: None.  Movement: Present. Denies leaking of fluid.   The following portions of the patient's history were reviewed and updated as appropriate: allergies, current medications, past family history, past medical history, past social history, past surgical history and problem list.   Objective:   Vitals:   07/31/19 1430  BP: 105/65  Pulse: 87  Weight: 216 lb 14.4 oz (98.4 kg)    Fetal Status: Fetal Heart Rate (bpm): 142   Movement: Present     General:  Alert, oriented and cooperative. Patient is in no acute distress.  Skin: Skin is warm and dry. No rash noted.   Cardiovascular: Normal heart rate noted  Respiratory: Normal respiratory effort, no problems with respiration noted  Abdomen: Soft, gravid, appropriate for gestational age.  Pain/Pressure: Present     Pelvic: Cervical exam deferred        Extremities: Normal range of motion.  Edema: None  Mental Status: Normal mood and affect. Normal behavior. Normal judgment and thought content.   Assessment and Plan:  Pregnancy: [id number] at [redacted]w[redacted]d 1. Encounter for supervision of other normal pregnancy in third trimester Pt desires Repeat C/S with BTL - discussed self pay cost vs IUD at time of C/S pt considering options.    2. Uterine scar from previous cesarean delivery Prev CDx3, will schedule 4th C/s at 39 weeks.   Preterm labor symptoms and general obstetric precautions including but not limited to vaginal bleeding,  contractions, leaking of fluid and fetal movement were reviewed in detail with the patient. Please refer to After Visit Summary for other counseling recommendations.   No follow-ups on file.  Future Appointments  Date Time Provider Department Center  08/13/2019  1:30 PM Dollene Cleveland, DO [facility]    Malachy Chamber, MD

## 2019-07-31 NOTE — Patient Instructions (Signed)

## 2019-08-01 ENCOUNTER — Encounter: Payer: Self-pay | Admitting: Family Medicine

## 2019-08-05 ENCOUNTER — Other Ambulatory Visit: Payer: Self-pay | Admitting: Family Medicine

## 2019-08-13 ENCOUNTER — Other Ambulatory Visit: Payer: Self-pay

## 2019-08-13 ENCOUNTER — Ambulatory Visit (INDEPENDENT_AMBULATORY_CARE_PROVIDER_SITE_OTHER): Payer: Self-pay | Admitting: Family Medicine

## 2019-08-13 VITALS — BP 96/60 | HR 87 | Wt 220.0 lb

## 2019-08-13 DIAGNOSIS — Z3483 Encounter for supervision of other normal pregnancy, third trimester: Secondary | ICD-10-CM

## 2019-08-13 NOTE — Patient Instructions (Addendum)
Thank you for coming in to see [place] today! Please see below to review our plan for today's visit:  1. [phone]: Get answers to your questions about billing and financial assistance. Call [facility] Patient Financial Services at [phone] or [phone] or send [place] a message through your [facility]. 2. Your next appointment should be with the [facility] clinic - Faculty.  Please call the clinic at [phone] if your symptoms worsen or you have any concerns. It was our pleasure to serve you!   Dr. Peggyann Shoals [facility] Family [facility]

## 2019-08-13 NOTE — Progress Notes (Signed)
  [facility] Prenatal Visit  Autumn Johnson is a 29 y.o. [phone] at [redacted]w[redacted]d here for routine follow up. She is dated by early ultrasound.  She reports no bleeding, no contractions, no cramping and no leaking.  She reports fetal movement. She denies vaginal bleeding, contractions, or loss of fluid.  See flow sheet for details.  Vitals:   08/13/19 1349  BP: 96/60  Pulse: 87     A/P: Pregnancy at [redacted]w[redacted]d.  Doing well.   1. Routine prenatal care:  Marland Kitchen Dating reviewed, dating tab is correct . Fetal heart tones: Appropriate 158 bpm . Fundal height: within expected range. Measures 41 cm from mons pubis, 23 cm from navel. . The patient does not have a history of HSV and valacyclovir is not indicated at this time.  . The patient has a history of Cesarean delivery and consult to [facility] placed for discussion, signing of consents, and scheduling delivery. Completed end of June 2021. . Infant feeding choice: Both  . Contraception choice: Tubal ligation, forms to be signed at 32 weeks  . Infant circumcision desired undecided . Influenza vaccine not administered as not influenza season.   . Tdap was given previously. . Childbirth and education classes were offered, respectfully declined . Pregnancy education regarding benefits of breastfeeding, contraception, fetal growth, expected weight gain, and safe infant sleep were discussed.  . Preterm labor and fetal movement precautions reviewed.   2. Pregnancy issues include the following and were addressed as appropriate today: . Patient was curious about cost of bilateral tubal ligation. . Problem list and pregnancy box updated: Yes.   Patient not yet scheduled for Ob Faculty clinic in third trimester.   Follow up 2 weeks with OB clinic.   Peggyann Shoals, DO [facility] Family Medicine, PGY-3 08/13/2019 2:21 PM

## 2019-08-28 ENCOUNTER — Other Ambulatory Visit: Payer: Self-pay

## 2019-08-28 ENCOUNTER — Ambulatory Visit (INDEPENDENT_AMBULATORY_CARE_PROVIDER_SITE_OTHER): Payer: HRSA Program | Admitting: Family Medicine

## 2019-08-28 VITALS — BP 98/62 | HR 74 | Wt 223.4 lb

## 2019-08-28 DIAGNOSIS — Z23 Encounter for immunization: Secondary | ICD-10-CM

## 2019-08-28 DIAGNOSIS — Z3483 Encounter for supervision of other normal pregnancy, third trimester: Secondary | ICD-10-CM

## 2019-08-28 NOTE — Progress Notes (Signed)
° °  [facility] OB Clinic Visit  Autumn Johnson is a 29 y.o. [phone] at [redacted]w[redacted]d (via [redacted]w[redacted]d u/s) who presents to [facility] for routine follow up. Prenatal course, history, notes, ultrasounds, and laboratory results reviewed. Spanish interpreter utilized during today's visit.   Denies persistent cramping/ctx, fluid leaking, vaginal bleeding, or decreased fetal movement. Taking PNV.  Has had some R sided sciatic type pain, better with activity, comes and goes.  Primary Prenatal Care Provider: Dr. Beaulah Corin  Postpartum Plans: - delivery planning: has repeat cesarean scheduled (3 prior c/s) - circumcision: undecided - reviewed circ info with patient today - feeding: mom desires to exclusively BF, counseled on expectations/normal newborn course and behavior (frequent/cluster feeds, supply/demand, importance of putting baby to breast often, limit unnecessary supplementation, feeding cues, etc). - pediatrician: [facility] - contraception: planned tubal ligation at time of c/s  FHR: 145 bpm Uterine size: 42cm (significantly limited by habitus)  Assessment & Plan  1. Routine prenatal care: - follow up in 1 week, needs GBS and gc/chlamydia obtained at that time - Counseled on available data regarding COVID vaccination in pregnancy (pregnant women were not included in original studies, however current available evidence suggests vaccine is safe and effective in pregnancy). Reviewed known risks of COVID-19 infection during pregnancy. Patient opted to have first dose of Pfizer vaccine administered today. No adverse reactions after 15 minutes observation. Has appointment for second dose in RN clinic in 3 weeks.   2. History of 3 prior cesarean sections - RCS scheduled for 39w - reviewed signs of labor, reasons to go to [facility]  3. Obesity: BMI currently 44. Plan has been for growth ultrasounds every 4-6 weeks. Last growth u/s on 07/21/19 with appropriate growth (report  not scanned in, but received result from HD). - schedule another growth ultrasound - see phone note for documentation - continue to monitor weight gain. Encouraged physical activity as she is able.   4. Sciatic pain: - improved with activity, encouraged continued activity both to alleviate pain and maintain fitness - advised ok to try tylenol  Next prenatal visit in 1 week. Plan for GBS, gc/chlamydia at that visit. Labor & fetal movement precautions discussed.  Levert Feinstein, MD [facility] Family [facility] Faculty

## 2019-08-28 NOTE — Progress Notes (Signed)
° °  Covid-19 Vaccination Clinic  Name:  Zahniya Zellars    MRN: [id number] DOB: 11-23-1990  08/28/2019  Ms. Covell was observed post Covid-19 immunization for 15 minutes without incident. She was provided with Vaccine Information Sheet and instruction to access the V-Safe system.   Ms. Manganelli was instructed to call 911 with any severe reactions post vaccine:  Difficulty breathing   Swelling of face and throat   A fast heartbeat   A bad rash all over body   Dizziness and weakness

## 2019-08-28 NOTE — Patient Instructions (Signed)
Tercer trimestre de embarazo Third Trimester of Pregnancy El tercer trimestre comprende desde la semana28 hasta la semana40 (desde el mes7 hasta el mes9). El tercer trimestre es un perodo en el que el beb en gestacin (feto) crece rpidamente. Hacia el final del noveno mes, el feto mide alrededor de 20pulgadas (45cm) de largo y pesa entre 6 y 10 libras (2,700 y 4,500kg). Cambios en el cuerpo durante el tercer trimestre Su organismo continuar atravesando por muchos cambios durante el embarazo. Estos cambios varan de una mujer a otra. Durante el tercer trimestre:  Seguir aumentando de peso. Es de esperar que aumente entre 25 y 35libras (11 y 16kg) hacia el final del embarazo.  Podrn aparecer las primeras estras en las caderas, el abdomen y las mamas.  Puede tener necesidad de orinar con ms frecuencia porque el feto baja hacia la pelvis y ejerce presin sobre la vejiga.  Puede desarrollar o continuar teniendo acidez estomacal. Esto se debe a que el aumento de las hormonas hace que los msculos en el tubo digestivo trabajen ms lentamente.  Puede desarrollar o continuar teniendo estreimiento debido a que el aumento de las hormonas ralentiza la digestin y hace que los msculos que empujan los desechos a travs de los intestinos se relajen.  Puede desarrollar hemorroides. Estas son venas hinchadas (venas varicosas) en el recto que pueden causar picazn o dolor.  Puede desarrollar venas hinchadas y abultadas (venas varicosas) en las piernas.  Puede presentar ms dolor en la pelvis, la espalda o los muslos. Esto se debe al aumento de peso y al aumento de las hormonas que relajan las articulaciones.  Tal vez haya cambios en el cabello. Esto cambios pueden incluir su engrosamiento, crecimiento rpido y cambios en la textura. Adems, a algunas mujeres se les cae el cabello durante o despus del embarazo, o tienen el cabello seco o fino. Lo ms probable es que el cabello se le normalice  despus del nacimiento del beb.  Sus pechos seguirn creciendo y se pondrn cada vez ms sensibles. Un lquido amarillo (calostro) puede salir de sus pechos. Esta es la primera leche que usted produce para su beb.  El ombligo puede salir hacia afuera.  Puede observar que se le hinchan las manos, el rostro o los tobillos.  Puede presentar un aumento del hormigueo o entumecimiento en las manos, brazos y piernas. La piel de su vientre tambin puede sentirse entumecida.  Puede sentir que le falta el aire debido a que se expande el tero.  Puede tener ms problemas para dormir. Esto puede deberse al tamao de su vientre, una mayor necesidad de orinar y un aumento en el metabolismo de su cuerpo.  Puede notar que el feto "baja" o lo siente ms bajo, en el abdomen (aligeramiento).  Puede tener un aumento de la secrecin vaginal.  Puede notar que las articulaciones se sienten flojas y puede sentir dolor alrededor del hueso plvico. Qu debe esperar en las visitas prenatales Le harn exmenes prenatales cada 2semanas hasta la semana36. A partir de ese momento le harn los exmenes semanales. Durante una visita prenatal de rutina:  La pesarn para asegurarse de que usted y el beb estn creciendo normalmente.  Le tomarn la presin arterial.  Le medirn el abdomen para controlar el desarrollo del beb.  Se escucharn los latidos cardacos fetales.  Se evaluarn los resultados de los estudios solicitados en visitas anteriores.  Le revisarn el cuello del tero cuando est prxima la fecha de parto para controlar si el cuello uterino   se ha afinado o adelgazado (borrado).  Le harn una prueba de estreptococos del grupo B. Esto sucede entre las semanas 35 y 37. El mdico puede preguntarle lo siguiente:  Cmo le gustara que fuera el parto.  Cmo se siente.  Si siente los movimientos del beb.  Si ha tenido sntomas anormales, como prdida de lquido, sangrado, dolores de cabeza  intensos o clicos abdominales.  Si est consumiendo algn producto que contenga tabaco, como cigarrillos, tabaco de mascar y cigarrillos electrnicos.  Si tiene alguna pregunta. Otros exmenes o estudios de deteccin que pueden realizarse durante el tercer trimestre incluyen lo siguiente:  Anlisis de sangre para controlar los niveles de hierro (anemia).  Controles fetales para determinar su salud, nivel de actividad y crecimiento. Si tiene alguna enfermedad o hay problemas durante el embarazo, le harn estudios.  Prueba sin estrs. Esta prueba verifica la salud de su beb y se utiliza para detectar signos de problemas, tales como si el beb no est recibiendo suficiente oxgeno. Durante esta prueba, se coloca un cinturn alrededor de su vientre. Al moverse el beb, se controla su frecuencia cardaca. Qu es el falso trabajo de parto? El falso trabajo de parto es una afeccin en la que se sienten pequeos e irregulares espasmos de los msculos del tero (contracciones) que generalmente desaparecen al hacer reposo, cambiar de posicin o al beber agua. Estas contracciones se llaman contracciones de Braxton Hicks. Las contracciones pueden durar horas, das o incluso semanas, antes de que el verdadero trabajo de parto se inicie. Si las contracciones ocurren a intervalos regulares, se vuelven ms frecuentes, aumentan en intensidad o se vuelven dolorosas, debera ver al mdico.  Cules son los signos del trabajo de parto?  Clicos abdominales.  Contracciones regulares que comienzan en intervalos de 10 minutos y se vuelven ms fuertes y ms frecuentes con el tiempo.  Contracciones que comienzan en la parte superior del tero y se extienden hacia abajo, a la zona inferior del abdomen y la espalda.  Aumento de la presin en la pelvis y dolor latente en la espalda.  Una secrecin de mucosidad acuosa o con sangre que sale de la vagina.  Prdida de lquido amnitico. Esto tambin se conoce como  "ruptura de la bolsa de las aguas". Esto puede ser un chorro o un goteo constante y lento de lquido. Informe a su mdico si tiene un color u olor extrao. Si tiene alguno de estos signos, llame a su mdico de inmediato, incluso si es antes de la fecha de parto. Siga estas indicaciones en su casa: Medicamentos  Siga las indicaciones del mdico en relacin con el uso de medicamentos. Durante el embarazo, hay medicamentos que pueden tomarse y otros que no.  Tome vitaminas prenatales que contengan por lo menos 600microgramos (?g) de cido flico.  Si est estreida, tome un laxante suave, si el mdico lo autoriza. Qu debe comer y beber   Lleve una dieta equilibrada que incluya gran cantidad de frutas y verduras frescas, cereales integrales, buenas fuentes de protenas como carnes magras, huevos o tofu, y lcteos descremados. El mdico la ayudar a determinar la cantidad de peso que puede aumentar.  No coma carne cruda ni quesos sin cocinar. Estos elementos contienen grmenes que pueden causar defectos congnitos en el beb.  Si no consume muchos alimentos con calcio, hable con su mdico sobre si debera tomar un suplemento diario de calcio.  La ingesta diaria de cuatro o cinco comidas pequeas en lugar de tres comidas abundantes.  Limite el   consumo de alimentos con alto contenido de grasas y azcares procesados, como alimentos fritos o dulces.  Para evitar el estreimiento: ? Bebe suficiente lquido para mantener la orina clara o de color amarillo plido. ? Consuma alimentos ricos en fibra, como frutas y verduras frescas, cereales integrales y frijoles. Actividad  Haga ejercicio solamente como se lo haya indicado el mdico. La mayora de las mujeres pueden continuar su rutina de ejercicios durante el embarazo. Intente realizar como mnimo 30minutos de actividad fsica por lo menos 5das a la semana. Deje de hacer ejercicio si experimenta contracciones uterinas.  Evite levantar pesos  excesivos.  No haga ejercicio en condiciones de calor o humedad extremas, o a grandes alturas.  Use zapatos cmodos de tacn bajo.  Adopte una buena postura.  Puede seguir teniendo relaciones sexuales, excepto que el mdico le diga lo contrario. Alivio del dolor y del malestar  Haga pausas frecuentes y descanse con las piernas elevadas si tiene calambres en las piernas o dolor en la zona lumbar.  Dese baos de asiento con agua tibia para aliviar el dolor o las molestias causadas por las hemorroides. Use una crema para las hemorroides si el mdico la autoriza.  Use un sostn que le brinde buen soporte para prevenir las molestias causadas por la sensibilidad en los pechos.  Si tiene venas varicosas: ? Use pantimedias que brinden soporte o medias de compresin como se lo haya indicado el mdico. ? Eleve los pies durante 15minutos, 3 o 4veces por da. Cuidados prenatales  Escriba sus preguntas. Llvelas cuando concurra a las visitas prenatales.  Concurra a todas las visitas prenatales tal como se lo haya indicado el mdico. Esto es importante. Seguridad  Use el cinturn de seguridad en todo momento mientras conduce.  Haga una lista de los nmeros de telfono de emergencia, que incluya los nmeros de telfono de familiares, amigos, el hospital y los departamentos de polica y bomberos. Instrucciones generales  Evite el contacto con las bandejas sanitarias de los gatos y la tierra que estos animales usan. Estos elementos contienen grmenes que pueden causar defectos congnitos en el beb. Si tiene un gato, pdale a alguien que limpie la caja de arena por usted.  No haga viajes largos excepto que sea absolutamente necesario y solo con la autorizacin de su mdico.  No se d baos de inmersin en agua caliente, baos turcos ni saunas.  No beber alcohol.  No consuma ningn producto que contenga nicotina o tabaco, como cigarrillos y cigarrillos electrnicos. Si necesita ayuda para  dejar de fumar, consulte al mdico.  No use hierbas medicinales ni medicamentos que no le hayan recetado. Estas sustancias qumicas afectan la formacin y el desarrollo del beb.  No se haga duchas vaginales ni use tampones o toallas higinicas perfumadas.  No mantenga las piernas cruzadas durante largos periodos de tiempo.  Para prepararse para la llegada de su beb: ? Tome clases prenatales para entender, practicar, y hacer preguntas sobre el trabajo de parto y el parto. ? Haga un ensayo de la partida al hospital. ? Visite el hospital y recorra el rea de maternidad. ? Pida un permiso de maternidad o paternidad a sus empleadores. ? Organice para que algn familiar o amigo cuide a sus mascotas mientras usted est en el hospital. ? Compre un asiento de seguridad orientado hacia atrs, y asegrese de saber cmo instalarlo en su automvil. ? Prepare el bolso que llevar al hospital. ? Prepare la habitacin del beb. Asegrese de quitar todas las almohadas y animales   de peluche de la cuna del beb para evitar la asfixia.  Visite a su dentista si no lo ha hecho durante el embarazo. Use un cepillo de dientes blando para higienizarse los dientes y psese el hilo dental con suavidad. Comunquese con un mdico si:  No est segura de que est en trabajo de parto o de que ha roto la bolsa de las aguas.  Se siente mareada.  Siente clicos leves, presin en la pelvis o dolor persistente en el abdomen.  Siente dolor en la parte inferior de la espalda.  Tiene nuseas, vmitos o diarrea persistentes.  Observa una secrecin vaginal inusual o con mal olor.  Siente dolor al orinar. Solicite ayuda de inmediato si:  Rompe la bolsa de las aguas antes de la semana 37.  Tiene contracciones regulares en intervalos de menos de 5 minutos antes de la semana 37.  Tiene fiebre.  Tiene una prdida de lquido por la vagina.  Tiene sangrado o pequeas prdidas vaginales.  Tiene dolor o clicos  abdominales intensos.  Baja de peso o sube de peso rpidamente.  Tiene dificultad para respirar y siente dolor de pecho.  Sbitamente se le hinchan mucho el rostro, las manos, los tobillos, los pies o las piernas.  Su beb se mueve menos de 10 veces en 2 horas.  Siente un dolor de cabeza intenso que no se alivia al tomar medicamentos.  Nota cambios en la visin. Resumen  El tercer trimestre comprende desde la semana28 hasta la semana40, es decir, desde el mes7 hasta el mes9. El tercer trimestre es un perodo en el que el beb en gestacin (feto) crece rpidamente.  Durante el tercer trimestre, su incomodidad puede aumentar a medida que usted y su beb continan aumentando de peso. Es posible que tenga dolor abdominal, en las piernas y en la espalda, problemas para dormir y una mayor necesidad de orinar.  Durante el tercer trimestre, sus pechos seguirn creciendo y se pondrn cada vez ms sensibles. Un lquido amarillo (calostro) puede salir de sus pechos. Esta es la primera leche que usted produce para su beb.  El falso trabajo de parto es una afeccin en la que se sienten pequeos e irregulares espasmos de los msculos del tero (contracciones) que a la larga desaparecen. Estas contracciones se llaman contracciones de Braxton Hicks. Las contracciones pueden durar horas, das o incluso semanas, antes de que el verdadero trabajo de parto se inicie.  Los signos del trabajo de parto pueden incluir: calambres abdominales; contracciones regulares que comienzan en intervalos de 10 minutos y se vuelven ms fuertes y ms frecuentes con el tiempo; una secrecin de mucosidad acuosa o con sangre que sale de la vagina; aumento de la presin en la pelvis y dolor latente en la espalda; y prdida de lquido amnitico. Esta informacin no tiene como fin reemplazar el consejo del mdico. Asegrese de hacerle al mdico cualquier pregunta que tenga. Document Revised: 06/06/2016 Document Reviewed:  06/06/2016 Elsevier Patient Education  2020 [employer]  

## 2019-09-01 ENCOUNTER — Telehealth: Payer: Self-pay | Admitting: Family Medicine

## 2019-09-01 DIAGNOSIS — Z98891 History of uterine scar from previous surgery: Secondary | ICD-10-CM

## 2019-09-01 NOTE — Telephone Encounter (Signed)
Noted u/s not scanned into chart, so called [facility] and got copy of last u/s from 6/14 faxed over. At that time, growth was appropriate.  Called patient today 7/26 using Spanish interpreter regarding need to schedule another growth u/s given maternal habitus limiting ability to assess fetal growth via fundal height.  Patient expressed frustration that her prior u/s results were never given to her at her prior OB visits. I reviewed the result with her and apologized that this had not previously been addressed in her recent prenatal visits.  Patient is agreeable to another u/s. I have completed the order form and will return to [facility] red team.   Red team, please schedule ultrasound appointment (next available appointment) at health department and fax order form in. Patient will need to be contacted with appointment info.  Of note, patient also reported that her feet and hands recently began swelling, primarily after she has been on her feet for much of the day. Denies any headache, vision changes, or abdominal pain. Recommend she drink plenty of water, prop her feet up as she is able. Counseled on signs/sx of PIH and advised to call us immediately if she develops headache, vision changes, abdominal pain.  Patient appreciative.  Latrelle Dodrill, MD

## 2019-09-02 NOTE — Telephone Encounter (Signed)
Appointment made for Ultrasound at:  [facility] Department 09/08/2019 1115 Patient is to carry $160.. which should cover cost however prices have increased.  Information was given to patient using Victorino Dike from [facility] Interpreters ID # [phone].  Form  faxed to [place] and given to [facility] for filing.  Glennie Hawk, CMA

## 2019-09-08 ENCOUNTER — Other Ambulatory Visit (HOSPITAL_COMMUNITY)
Admission: RE | Admit: 2019-09-08 | Discharge: 2019-09-08 | Disposition: A | Discharge status: Home / Self Care | Payer: Self-pay | Source: Ambulatory Visit | Attending: Family Medicine | Admitting: Family Medicine

## 2019-09-08 ENCOUNTER — Encounter: Payer: Self-pay | Admitting: Family Medicine

## 2019-09-08 ENCOUNTER — Ambulatory Visit (INDEPENDENT_AMBULATORY_CARE_PROVIDER_SITE_OTHER): Payer: Self-pay | Admitting: Family Medicine

## 2019-09-08 ENCOUNTER — Other Ambulatory Visit: Payer: Self-pay

## 2019-09-08 VITALS — BP 96/58 | HR 84 | Wt 223.0 lb

## 2019-09-08 DIAGNOSIS — Z3483 Encounter for supervision of other normal pregnancy, third trimester: Secondary | ICD-10-CM

## 2019-09-08 NOTE — Patient Instructions (Signed)
Please go to the MAU if you feel strong contractions, vaginal leakage, vaginal bleeding, or other concerning symptoms.  We will see you in 1 week!  Peggyann Shoals, DO [facility], PGY-3 09/08/2019 3:40 PM

## 2019-09-08 NOTE — Progress Notes (Signed)
  [facility] Prenatal Visit  Autumn Johnson is a 29 y.o. [phone] at [redacted]w[redacted]d here for routine follow up. She is dated by early ultrasound.  She reports occasional contractions, occasional right-sided leg/butt pain, and occasional pressure in her pelvis especially with walking. She reports fetal movement. She denies vaginal bleeding, contractions, or loss of fluid. See flow sheet for details.  Vitals:   09/08/19 1501  BP: (!) 96/58  Pulse: 84    A/P: Pregnancy at [redacted]w[redacted]d.  Doing well.   1. Routine prenatal care  . Dating reviewed, dating tab is correct . Fetal heart tones Appropriate 141 . Fundal height >2 cm from expected size given dating, discussed with preceptor.  - measurement suggests growth that is large for gestational age however patient has received growth Ultrasounds which show normal parameters.  . Fetal position confirmed Vertex using Ultrasound .  Marland Kitchen GBS collected today.  . Repeat GC/CT collected today.  . The patient does not have a history of HSV and valacyclovir is not indicated at this time.  . Infant feeding choice: Breastfeeding exclusively . Contraception choice: Tubal ligation . Infant circumcision desired - yes . Influenza vaccine not administered as not influenza season.   . Tdap previously administered between 27-36 weeks  . Pregnancy education regarding preterm labor, fetal movement,  benefits of breastfeeding, contraception, fetal growth, expected weight gain, and safe infant sleep were discussed.    2. Pregnancy issues include the following and were addressed as appropriate today:  . Growth measurement large for gestational age - continues with serial growth scans q4-6 weeks . Problem list and pregnancy box updated: Yes.  -           Scheduled for C-section September 23, 2019.  -           Measurements large for gestational age - patient had growth ultrasound this morning, results to be faxed to clinic tomorrow 8/3 and discussed with patient at  follow up appt -           GC/Chlamydia and GBS swab performed at today's visit 8/2, follow up results at next visit (I will follow up sooner if treatment needed) -           Baby's vertex positioning appreciated on Ultrasound at today's visit 8/2 (at [redacted]w[redacted]d), but mom is scheduled for C-section anyways on 09/23/2019  Follow up 1 week. Strict return/MAU precautions given.    Peggyann Shoals, DO [facility] Family Medicine, PGY-3 09/08/2019 3:42 PM

## 2019-09-09 ENCOUNTER — Encounter (HOSPITAL_COMMUNITY): Payer: Self-pay

## 2019-09-09 LAB — CERVICOVAGINAL ANCILLARY ONLY
Chlamydia: NEGATIVE
Comment: NEGATIVE
Comment: NORMAL
Neisseria Gonorrhea: NEGATIVE

## 2019-09-09 NOTE — Pre-Procedure Instructions (Signed)
[phone] interpreter number

## 2019-09-09 NOTE — Patient Instructions (Signed)
Instrucciones Para Antes de la Ciruga   Su ciruga est programada para 09/23/2019  (your procedure is scheduled on) Entre por la entrada principal del [facility]  a las 1030 de la [place] -(enter through the main entrance at [facility] at [address] [place] [phone] para informarnos de su llegada. (pick up phone, dial [phone] on arrival)     Por favor llame al [phone] si tiene algn problema en la maana de la ciruga (please call this number if you have any problems the morning of surgery.)                  Recuerde: (Remember)  No coma alimentos. (Do not eat food (After Midnight) Desps de medianoche)    No tome lquidos claros. (Do not drink clear liquids (After Midnight) Desps de medianoche)    No use joyas, maquillaje de ojos, lpiz labial, crema para el cuerpo o esmalte de uas oscuro. (Do not wear jewelry, eye makeup, lipstick, body lotion, or dark fingernail polish). Puede usar desodorante (you may wear deodorant)    No se afeite 48 horas de su ciruga. (Do not shave 48 hours before your surgery)    No traiga objetos de valor al hospital.  [place] no se hace responsable de ninguna pertenencia, ni objetos de valor que haya trado al hospital. (Do not bring valuable to the hospital.  [place] is not responsible for any belongings or valuables brought to the hospital)   [facility] medicinas en la maana de la ciruga con un SORBITO de agua nada (take these meds the morning of surgery with a SIP of water)     Durante la ciruga no se pueden usar lentes de contacto, dentaduras o puentes. (Contacts, dentures or bridgework cannot be worn in surgery).   Si va a ser ingresado despus de la ciruga, deje la [employer] en el carro hasta que se le haya asignado una habitacin. (If you are to be admitted after surgery, leave suitcase in car until your room has been assigned.)   A los pacientes que se les d de  alta el mismo da no se les permitir manejar a casa.  (Patients discharged on the day of surgery will not be allowed to drive home)    [place] y nmero de telfono del Programmer, multimedia na. (Name and telephone number of your driver)   Instrucciones especiales N/A (Special Instructions)   Por favor, lea las hojas informativas que le entregaron. (Please read over the following fact sheets that you were given) Surgical Site Infection Prevention

## 2019-09-12 LAB — CULTURE, BETA STREP (GROUP B ONLY): Strep Gp B Culture: NEGATIVE

## 2019-09-18 ENCOUNTER — Other Ambulatory Visit: Payer: Self-pay

## 2019-09-18 ENCOUNTER — Ambulatory Visit (INDEPENDENT_AMBULATORY_CARE_PROVIDER_SITE_OTHER): Payer: Self-pay

## 2019-09-18 DIAGNOSIS — Z23 Encounter for immunization: Secondary | ICD-10-CM | POA: Diagnosis not present

## 2019-09-18 NOTE — Progress Notes (Signed)
Patient reports to nurse clinic for second covid vaccination. Used spanish interpreter [employer] [phone] for visit. Patient answered negative to all screening questions.Vaccine given in left deltoid.  Observed for 15 minutes. No reaction noted.   Veronda Prude, RN

## 2019-09-19 ENCOUNTER — Other Ambulatory Visit: Payer: Self-pay

## 2019-09-19 ENCOUNTER — Ambulatory Visit (INDEPENDENT_AMBULATORY_CARE_PROVIDER_SITE_OTHER): Payer: Self-pay | Admitting: Family Medicine

## 2019-09-19 ENCOUNTER — Encounter: Payer: Self-pay | Admitting: Family Medicine

## 2019-09-19 VITALS — BP 98/56 | HR 79 | Ht 59.0 in | Wt 225.6 lb

## 2019-09-19 DIAGNOSIS — Z3483 Encounter for supervision of other normal pregnancy, third trimester: Secondary | ICD-10-CM

## 2019-09-19 NOTE — Progress Notes (Signed)
  [facility] Prenatal Visit  Autumn Johnson is a 29 y.o. [phone] at [redacted]w[redacted]d here for routine follow up. She is dated by early ultrasound.  She reports occasional contractions and vaginal lump she would like evaluated which is occasionally bothersome. Non-itchy.  She reports fetal movement. She denies vaginal bleeding, contractions, or loss of fluid. See flow sheet for details.  Has scheduled C-section for 8/17 with Dr. Adrian Blackwater, previous C-section x 3  Vitals:   09/19/19 0957  BP: (!) 98/56  Pulse: 79  SpO2: 98%  Fetal heart tones: 135 bpm Fundal height: Approximately 40 cm Pelvic exam: VULVA: normal appearing vulva with no masses or tenderness, do note a flesh-colored subcutaneous few mm nodule present at 9 o'clock position of volvulus without any tenderness or skin erythema. No rash. VAGINA: normal appearing vagina with normal color and discharge, no lesions.  A/P: Pregnancy at [redacted]w[redacted]d.  Doing well.   1. Routine prenatal care . Dating reviewed, dating tab is correct . Fetal heart tones Appropriate . Fundal height > 2 cm from expected height, consistently been elevated with appropriate growth on U/S. Marland Kitchen Fetal position confirmed last visit, undergoing C-section  . Infant feeding choice: Breastfeeding . Contraception choice: tubal ligation during C-section  . Infant circumcision desired yes . Influenza vaccine not administered as not influenza season.   . Tdap previously administered between 27-36 weeks  . GBS and gc/chlamydia testing results were reviewed today and normal.   . Pregnancy education regarding labor, fetal movement, benefits of breastfeeding, contraception were discussed.  . Labor and fetal movement precautions reviewed.  . Repeat C-section scheduled at 39 weeks on 8/17  . Vaginal lesion: Appears benign.  Not suggestive of infection or HSV, provided reassurance.  2. Pregnancy issues include the following and were addressed as appropriate today:  . Elevated  BMI 40 prior to pregnancy, currently 45.  Appropriate weight gain during  pregnancy, approximately 14 lbs.   . Problem list and pregnancy box updated: Yes.    Will touch base with Dr. Dareen Piano to obtain recent U/S on 8/2 for growth.    Follow-up scheduled for repeat C-section on 8/17, otherwise counseled on MAU precautions prior to including any leakage of fluid, consistent contractions/abdominal pain, or vaginal bleeding.  Allayne Stack, DO

## 2019-09-19 NOTE — Patient Instructions (Signed)
If any leakage of fluid, consistent and persistent contractions, or vaginal bleeding please go to MAU.

## 2019-09-21 ENCOUNTER — Other Ambulatory Visit: Payer: Self-pay

## 2019-09-21 ENCOUNTER — Other Ambulatory Visit (HOSPITAL_COMMUNITY)
Admission: RE | Admit: 2019-09-21 | Discharge: 2019-09-21 | Disposition: A | Discharge status: Home / Self Care | Payer: Self-pay | Source: Ambulatory Visit | Attending: Obstetrics & Gynecology | Admitting: Obstetrics & Gynecology

## 2019-09-21 DIAGNOSIS — Z20822 Contact with and (suspected) exposure to covid-19: Secondary | ICD-10-CM | POA: Insufficient documentation

## 2019-09-21 LAB — CBC
HCT: 34.9 % — ABNORMAL LOW (ref 36.0–46.0)
Hemoglobin: 11.7 g/dL — ABNORMAL LOW (ref 12.0–15.0)
MCH: 31 pg (ref 26.0–34.0)
MCHC: 33.5 g/dL (ref 30.0–36.0)
MCV: 92.6 fL (ref 80.0–100.0)
Platelets: 189 10*3/uL (ref 150–400)
RBC: 3.77 MIL/uL — ABNORMAL LOW (ref 3.87–5.11)
RDW: 14.6 % (ref 11.5–15.5)
WBC: 6.5 10*3/uL (ref 4.0–10.5)
nRBC: 0 % (ref 0.0–0.2)

## 2019-09-21 LAB — SARS CORONAVIRUS 2 (TAT 6-24 HRS): SARS Coronavirus 2: NEGATIVE

## 2019-09-21 LAB — RAPID HIV SCREEN (HIV 1/2 AB+AG)
HIV 1/2 Antibodies: NONREACTIVE
HIV-1 P24 Antigen - HIV24: NONREACTIVE

## 2019-09-21 LAB — RPR: RPR Ser Ql: NONREACTIVE

## 2019-09-21 NOTE — MAU Note (Signed)
Pt here for swab and lab draw. Denies symptoms or sick contacts. Swab collected.  Video interpretor used [employer] [phone]

## 2019-09-22 NOTE — Anesthesia Preprocedure Evaluation (Addendum)
Anesthesia Evaluation  Patient identified by MRN, date of birth, ID band Patient awake    Reviewed: Allergy & Precautions, H&P , NPO status , Patient's Chart, lab work & pertinent test results  Airway Mallampati: II  TM Distance: >3 FB Neck ROM: Full    Dental no notable dental hx. (+) Teeth Intact, Dental Advisory Given   Pulmonary neg pulmonary ROS,    Pulmonary exam normal breath sounds clear to auscultation       Cardiovascular Exercise Tolerance: Good negative cardio ROS Normal cardiovascular exam Rhythm:Regular Rate:Normal     Neuro/Psych negative neurological ROS  negative psych ROS   GI/Hepatic negative GI ROS, Neg liver ROS,   Endo/Other  negative endocrine ROS  Renal/GU negative Renal ROS     Musculoskeletal negative musculoskeletal ROS (+)   Abdominal (+) + obese,   Peds  Hematology negative hematology ROS (+)   Anesthesia Other Findings   Reproductive/Obstetrics (+) Pregnancy                           Anesthesia Physical Anesthesia Plan  ASA: III  Anesthesia Plan: General   Post-op Pain Management:    Induction: Intravenous, Rapid sequence and Cricoid pressure planned  PONV Risk Score and Plan: 2 and Treatment may vary due to age or medical condition and Ondansetron  Airway Management Planned: Oral ETT  Additional Equipment: None  Intra-op Plan:   Post-operative Plan:   Informed Consent: I have reviewed the patients History and Physical, chart, labs and discussed the procedure including the risks, benefits and alternatives for the proposed anesthesia with the patient or authorized representative who has indicated his/her understanding and acceptance.     Dental advisory given  Plan Discussed with: CRNA and Anesthesiologist  Anesthesia Plan Comments: (Rpt c/s x4 under cse)      Anesthesia Quick Evaluation

## 2019-09-23 ENCOUNTER — Inpatient Hospital Stay (HOSPITAL_COMMUNITY)
Admission: RE | Admit: 2019-09-23 | Discharge: 2019-09-25 | DRG: 784 | Disposition: A | Discharge status: Home / Self Care | Payer: Medicaid Other | Attending: Family Medicine | Admitting: Family Medicine

## 2019-09-23 ENCOUNTER — Encounter (HOSPITAL_COMMUNITY)
Admission: RE | Disposition: A | Discharge status: Home / Self Care | Payer: Self-pay | Source: Home / Self Care | Attending: Family Medicine

## 2019-09-23 ENCOUNTER — Inpatient Hospital Stay (HOSPITAL_COMMUNITY): Payer: Medicaid Other | Admitting: Anesthesiology

## 2019-09-23 ENCOUNTER — Other Ambulatory Visit: Payer: Self-pay

## 2019-09-23 ENCOUNTER — Encounter (HOSPITAL_COMMUNITY): Payer: Self-pay | Admitting: Family Medicine

## 2019-09-23 DIAGNOSIS — E669 Obesity, unspecified: Secondary | ICD-10-CM | POA: Diagnosis present

## 2019-09-23 DIAGNOSIS — Z302 Encounter for sterilization: Secondary | ICD-10-CM | POA: Diagnosis not present

## 2019-09-23 DIAGNOSIS — Z3A39 39 weeks gestation of pregnancy: Secondary | ICD-10-CM

## 2019-09-23 DIAGNOSIS — Z98891 History of uterine scar from previous surgery: Secondary | ICD-10-CM

## 2019-09-23 DIAGNOSIS — O99214 Obesity complicating childbirth: Secondary | ICD-10-CM | POA: Diagnosis present

## 2019-09-23 DIAGNOSIS — Z3490 Encounter for supervision of normal pregnancy, unspecified, unspecified trimester: Secondary | ICD-10-CM

## 2019-09-23 DIAGNOSIS — Z603 Acculturation difficulty: Secondary | ICD-10-CM

## 2019-09-23 DIAGNOSIS — O99210 Obesity complicating pregnancy, unspecified trimester: Secondary | ICD-10-CM | POA: Diagnosis present

## 2019-09-23 DIAGNOSIS — O34211 Maternal care for low transverse scar from previous cesarean delivery: Principal | ICD-10-CM | POA: Diagnosis present

## 2019-09-23 DIAGNOSIS — O0933 Supervision of pregnancy with insufficient antenatal care, third trimester: Secondary | ICD-10-CM

## 2019-09-23 LAB — POCT I-STAT EG7
Acid-base deficit: 6 mmol/L — ABNORMAL HIGH (ref 0.0–2.0)
Bicarbonate: 19.1 mmol/L — ABNORMAL LOW (ref 20.0–28.0)
Calcium, Ion: 1.21 mmol/L (ref 1.15–1.40)
HCT: 24 % — ABNORMAL LOW (ref 36.0–46.0)
Hemoglobin: 8.2 g/dL — ABNORMAL LOW (ref 12.0–15.0)
O2 Saturation: 96 %
Patient temperature: 36.6
Potassium: 4.1 mmol/L (ref 3.5–5.1)
Sodium: 136 mmol/L (ref 135–145)
TCO2: 20 mmol/L — ABNORMAL LOW (ref 22–32)
pCO2, Ven: 34 mmHg — ABNORMAL LOW (ref 44.0–60.0)
pH, Ven: 7.356 (ref 7.250–7.430)
pO2, Ven: 85 mmHg — ABNORMAL HIGH (ref 32.0–45.0)

## 2019-09-23 LAB — PREPARE RBC (CROSSMATCH)

## 2019-09-23 SURGERY — Surgical Case
Anesthesia: Regional

## 2019-09-23 MED ORDER — LACTATED RINGERS IV SOLN
125.0000 mL/h | INTRAVENOUS | Status: DC
  Administered 2019-09-23 (×2): 125 mL/h via INTRAVENOUS

## 2019-09-23 MED ORDER — SUCCINYLCHOLINE CHLORIDE 200 MG/10ML IV SOSY
PREFILLED_SYRINGE | INTRAVENOUS | Status: AC
  Filled 2019-09-23: qty 10

## 2019-09-23 MED ORDER — ALBUMIN HUMAN 5 % IV SOLN: INTRAVENOUS | Status: DC | PRN

## 2019-09-23 MED ORDER — FENTANYL CITRATE (PF) 100 MCG/2ML IJ SOLN
INTRAMUSCULAR | Status: AC
  Filled 2019-09-23: qty 2

## 2019-09-23 MED ORDER — SCOPOLAMINE 1 MG/3DAYS TD PT72
MEDICATED_PATCH | TRANSDERMAL | Status: AC
  Filled 2019-09-23: qty 1

## 2019-09-23 MED ORDER — SIMETHICONE 80 MG PO CHEW
80.0000 mg | CHEWABLE_TABLET | Freq: Three times a day (TID) | ORAL | Status: DC
  Administered 2019-09-23 – 2019-09-25 (×5): 80 mg via ORAL
  Filled 2019-09-23 (×5): qty 1

## 2019-09-23 MED ORDER — KETOROLAC TROMETHAMINE 30 MG/ML IJ SOLN
INTRAMUSCULAR | Status: AC
  Filled 2019-09-23: qty 1

## 2019-09-23 MED ORDER — WITCH HAZEL-GLYCERIN EX PADS: 1.0000 "application " | MEDICATED_PAD | CUTANEOUS | Status: DC | PRN

## 2019-09-23 MED ORDER — HYDROMORPHONE HCL 1 MG/ML IJ SOLN
INTRAMUSCULAR | Status: AC
  Filled 2019-09-23: qty 0.5

## 2019-09-23 MED ORDER — DEXAMETHASONE SODIUM PHOSPHATE 4 MG/ML IJ SOLN
INTRAMUSCULAR | Status: DC | PRN
  Administered 2019-09-23: 10 mg via INTRAVENOUS

## 2019-09-23 MED ORDER — NALBUPHINE HCL 10 MG/ML IJ SOLN: 5.0000 mg | Freq: Once | INTRAMUSCULAR | Status: DC | PRN

## 2019-09-23 MED ORDER — FENTANYL CITRATE (PF) 100 MCG/2ML IJ SOLN
INTRAMUSCULAR | Status: DC | PRN
  Administered 2019-09-23: 100 ug via INTRAVENOUS
  Administered 2019-09-23 (×2): 50 ug via INTRAVENOUS

## 2019-09-23 MED ORDER — DIPHENHYDRAMINE HCL 25 MG PO CAPS: 25.0000 mg | ORAL_CAPSULE | ORAL | Status: DC | PRN

## 2019-09-23 MED ORDER — POVIDONE-IODINE 10 % EX SWAB: 2.0000 "application " | Freq: Once | CUTANEOUS | Status: DC

## 2019-09-23 MED ORDER — TETANUS-DIPHTH-ACELL PERTUSSIS 5-2.5-18.5 LF-MCG/0.5 IM SUSP: 0.5000 mL | Freq: Once | INTRAMUSCULAR | Status: DC

## 2019-09-23 MED ORDER — NALBUPHINE HCL 10 MG/ML IJ SOLN: 5.0000 mg | INTRAMUSCULAR | Status: DC | PRN

## 2019-09-23 MED ORDER — PROPOFOL 10 MG/ML IV BOLUS
INTRAVENOUS | Status: DC | PRN
  Administered 2019-09-23: 150 mg via INTRAVENOUS

## 2019-09-23 MED ORDER — KETOROLAC TROMETHAMINE 30 MG/ML IJ SOLN
30.0000 mg | Freq: Four times a day (QID) | INTRAMUSCULAR | Status: AC
  Administered 2019-09-23 – 2019-09-24 (×4): 30 mg via INTRAVENOUS
  Filled 2019-09-23 (×4): qty 1

## 2019-09-23 MED ORDER — SIMETHICONE 80 MG PO CHEW
80.0000 mg | CHEWABLE_TABLET | ORAL | Status: DC
  Administered 2019-09-23 – 2019-09-24 (×2): 80 mg via ORAL
  Filled 2019-09-23 (×2): qty 1

## 2019-09-23 MED ORDER — DIPHENHYDRAMINE HCL 25 MG PO CAPS: 25.0000 mg | ORAL_CAPSULE | Freq: Four times a day (QID) | ORAL | Status: DC | PRN

## 2019-09-23 MED ORDER — ACETAMINOPHEN 10 MG/ML IV SOLN
1000.0000 mg | Freq: Once | INTRAVENOUS | Status: AC
  Administered 2019-09-23: 1000 mg via INTRAVENOUS

## 2019-09-23 MED ORDER — MORPHINE SULFATE (PF) 0.5 MG/ML IJ SOLN
INTRAMUSCULAR | Status: DC | PRN
  Administered 2019-09-23 (×2): 2.5 mg via INTRATHECAL

## 2019-09-23 MED ORDER — PRENATAL MULTIVITAMIN CH
1.0000 | ORAL_TABLET | Freq: Every day | ORAL | Status: DC
  Administered 2019-09-24: 1 via ORAL
  Filled 2019-09-23: qty 1

## 2019-09-23 MED ORDER — OXYTOCIN-SODIUM CHLORIDE 30-0.9 UT/500ML-% IV SOLN: 2.5000 [IU]/h | INTRAVENOUS | Status: AC

## 2019-09-23 MED ORDER — MIDAZOLAM HCL 2 MG/2ML IJ SOLN
INTRAMUSCULAR | Status: DC | PRN
  Administered 2019-09-23: 2 mg via INTRAVENOUS

## 2019-09-23 MED ORDER — ONDANSETRON HCL 4 MG/2ML IJ SOLN: 4.0000 mg | Freq: Three times a day (TID) | INTRAMUSCULAR | Status: DC | PRN

## 2019-09-23 MED ORDER — MENTHOL 3 MG MT LOZG: 1.0000 | LOZENGE | OROMUCOSAL | Status: DC | PRN

## 2019-09-23 MED ORDER — ACETAMINOPHEN 10 MG/ML IV SOLN
INTRAVENOUS | Status: AC
  Filled 2019-09-23: qty 100

## 2019-09-23 MED ORDER — PROPOFOL 10 MG/ML IV BOLUS
INTRAVENOUS | Status: AC
  Filled 2019-09-23: qty 20

## 2019-09-23 MED ORDER — MORPHINE SULFATE (PF) 0.5 MG/ML IJ SOLN
INTRAMUSCULAR | Status: AC
  Filled 2019-09-23: qty 10

## 2019-09-23 MED ORDER — DIBUCAINE (PERIANAL) 1 % EX OINT: 1.0000 "application " | TOPICAL_OINTMENT | CUTANEOUS | Status: DC | PRN

## 2019-09-23 MED ORDER — NALOXONE HCL 0.4 MG/ML IJ SOLN: 0.4000 mg | INTRAMUSCULAR | Status: DC | PRN

## 2019-09-23 MED ORDER — SENNOSIDES-DOCUSATE SODIUM 8.6-50 MG PO TABS
2.0000 | ORAL_TABLET | ORAL | Status: DC
  Administered 2019-09-23 – 2019-09-24 (×2): 2 via ORAL
  Filled 2019-09-23 (×2): qty 2

## 2019-09-23 MED ORDER — IBUPROFEN 800 MG PO TABS
800.0000 mg | ORAL_TABLET | Freq: Four times a day (QID) | ORAL | Status: DC
  Administered 2019-09-24 – 2019-09-25 (×3): 800 mg via ORAL
  Filled 2019-09-23 (×3): qty 1

## 2019-09-23 MED ORDER — OXYCODONE HCL 5 MG PO TABS: 5.0000 mg | ORAL_TABLET | ORAL | Status: DC | PRN

## 2019-09-23 MED ORDER — SIMETHICONE 80 MG PO CHEW: 80.0000 mg | CHEWABLE_TABLET | ORAL | Status: DC | PRN

## 2019-09-23 MED ORDER — ONDANSETRON HCL 4 MG/2ML IJ SOLN: 4.0000 mg | Freq: Once | INTRAMUSCULAR | Status: DC | PRN

## 2019-09-23 MED ORDER — CEFAZOLIN SODIUM-DEXTROSE 2-4 GM/100ML-% IV SOLN
2.0000 g | INTRAVENOUS | Status: DC
  Administered 2019-09-23: 2 g via INTRAVENOUS

## 2019-09-23 MED ORDER — SODIUM CHLORIDE 0.9% FLUSH: 3.0000 mL | INTRAVENOUS | Status: DC | PRN

## 2019-09-23 MED ORDER — TRANEXAMIC ACID-NACL 1000-0.7 MG/100ML-% IV SOLN
INTRAVENOUS | Status: DC | PRN
  Administered 2019-09-23: 1000 mg via INTRAVENOUS

## 2019-09-23 MED ORDER — CEFAZOLIN SODIUM-DEXTROSE 2-3 GM-%(50ML) IV SOLR
INTRAVENOUS | Status: DC | PRN
  Administered 2019-09-23: 2 g via INTRAVENOUS

## 2019-09-23 MED ORDER — MIDAZOLAM HCL 2 MG/2ML IJ SOLN
INTRAMUSCULAR | Status: AC
  Filled 2019-09-23: qty 2

## 2019-09-23 MED ORDER — NALOXONE HCL 4 MG/10ML IJ SOLN
1.0000 ug/kg/h | INTRAVENOUS | Status: DC | PRN
  Filled 2019-09-23: qty 5

## 2019-09-23 MED ORDER — PHENYLEPHRINE HCL-NACL 20-0.9 MG/250ML-% IV SOLN
INTRAVENOUS | Status: DC | PRN
  Administered 2019-09-23: 40 ug/min via INTRAVENOUS

## 2019-09-23 MED ORDER — KETOROLAC TROMETHAMINE 30 MG/ML IJ SOLN: 30.0000 mg | Freq: Once | INTRAMUSCULAR | Status: DC | PRN

## 2019-09-23 MED ORDER — HYDROMORPHONE HCL 1 MG/ML IJ SOLN
0.2500 mg | INTRAMUSCULAR | Status: DC | PRN
  Administered 2019-09-23 (×4): 0.5 mg via INTRAVENOUS

## 2019-09-23 MED ORDER — ONDANSETRON HCL 4 MG/2ML IJ SOLN
INTRAMUSCULAR | Status: DC | PRN
  Administered 2019-09-23: 4 mg via INTRAVENOUS

## 2019-09-23 MED ORDER — SUCCINYLCHOLINE CHLORIDE 200 MG/10ML IV SOSY
PREFILLED_SYRINGE | INTRAVENOUS | Status: DC | PRN
  Administered 2019-09-23: 140 mg via INTRAVENOUS

## 2019-09-23 MED ORDER — LACTATED RINGERS IV SOLN: INTRAVENOUS | Status: DC

## 2019-09-23 MED ORDER — OXYCODONE HCL 5 MG/5ML PO SOLN: 5.0000 mg | Freq: Once | ORAL | Status: DC | PRN

## 2019-09-23 MED ORDER — ENOXAPARIN SODIUM 60 MG/0.6ML ~~LOC~~ SOLN
50.0000 mg | SUBCUTANEOUS | Status: DC
  Administered 2019-09-24 – 2019-09-25 (×2): 50 mg via SUBCUTANEOUS
  Filled 2019-09-23 (×2): qty 0.6

## 2019-09-23 MED ORDER — COCONUT OIL OIL: 1.0000 "application " | TOPICAL_OIL | Status: DC | PRN

## 2019-09-23 MED ORDER — OXYCODONE HCL 5 MG PO TABS: 5.0000 mg | ORAL_TABLET | Freq: Once | ORAL | Status: DC | PRN

## 2019-09-23 MED ORDER — ACETAMINOPHEN 500 MG PO TABS
1000.0000 mg | ORAL_TABLET | Freq: Three times a day (TID) | ORAL | Status: DC
  Administered 2019-09-23 – 2019-09-25 (×5): 1000 mg via ORAL
  Filled 2019-09-23 (×5): qty 2

## 2019-09-23 MED ORDER — OXYTOCIN-SODIUM CHLORIDE 30-0.9 UT/500ML-% IV SOLN
INTRAVENOUS | Status: DC | PRN
  Administered 2019-09-23: 30 [IU] via INTRAVENOUS

## 2019-09-23 MED ORDER — SODIUM CHLORIDE 0.9% IV SOLUTION: Freq: Once | INTRAVENOUS | Status: DC

## 2019-09-23 MED ORDER — SCOPOLAMINE 1 MG/3DAYS TD PT72
1.0000 | MEDICATED_PATCH | Freq: Once | TRANSDERMAL | Status: DC
  Administered 2019-09-23: 1.5 mg via TRANSDERMAL

## 2019-09-23 MED ORDER — SODIUM CHLORIDE 0.9 % IV SOLN: INTRAVENOUS | Status: DC | PRN

## 2019-09-23 MED ORDER — CEFAZOLIN SODIUM-DEXTROSE 2-4 GM/100ML-% IV SOLN
INTRAVENOUS | Status: AC
  Filled 2019-09-23: qty 100

## 2019-09-23 MED ORDER — DIPHENHYDRAMINE HCL 50 MG/ML IJ SOLN: 12.5000 mg | INTRAMUSCULAR | Status: DC | PRN

## 2019-09-23 SURGICAL SUPPLY — 37 items
BENZOIN TINCTURE PRP APPL 2/3 (GAUZE/BANDAGES/DRESSINGS) ×3 IMPLANT
CHLORAPREP W/TINT 26ML (MISCELLANEOUS) ×3 IMPLANT
CLAMP CORD UMBIL (MISCELLANEOUS) IMPLANT
CLOSURE WOUND 1/2 X4 (GAUZE/BANDAGES/DRESSINGS) ×1
CLOTH BEACON ORANGE TIMEOUT ST (SAFETY) ×3 IMPLANT
DRESSING PREVENA PLUS CUSTOM (GAUZE/BANDAGES/DRESSINGS) ×1 IMPLANT
DRSG OPSITE POSTOP 4X10 (GAUZE/BANDAGES/DRESSINGS) ×3 IMPLANT
DRSG PREVENA PLUS CUSTOM (GAUZE/BANDAGES/DRESSINGS) ×3
ELECT REM PT RETURN 9FT ADLT (ELECTROSURGICAL) ×3
ELECTRODE REM PT RTRN 9FT ADLT (ELECTROSURGICAL) ×1 IMPLANT
EXTRACTOR VACUUM M CUP 4 TUBE (SUCTIONS) IMPLANT
EXTRACTOR VACUUM M CUP 4' TUBE (SUCTIONS)
GLOVE BIOGEL PI IND STRL 7.0 (GLOVE) ×2 IMPLANT
GLOVE BIOGEL PI IND STRL 7.5 (GLOVE) ×2 IMPLANT
GLOVE BIOGEL PI INDICATOR 7.0 (GLOVE) ×4
GLOVE BIOGEL PI INDICATOR 7.5 (GLOVE) ×4
GLOVE ECLIPSE 7.5 STRL STRAW (GLOVE) ×3 IMPLANT
GOWN STRL REUS W/TWL LRG LVL3 (GOWN DISPOSABLE) ×9 IMPLANT
KIT ABG SYR 3ML LUER SLIP (SYRINGE) IMPLANT
NEEDLE HYPO 25X5/8 SAFETYGLIDE (NEEDLE) IMPLANT
NS IRRIG 1000ML POUR BTL (IV SOLUTION) ×3 IMPLANT
PACK C SECTION WH (CUSTOM PROCEDURE TRAY) ×3 IMPLANT
PAD OB MATERNITY 4.3X12.25 (PERSONAL CARE ITEMS) ×3 IMPLANT
PENCIL SMOKE EVAC W/HOLSTER (ELECTROSURGICAL) ×3 IMPLANT
RETAINER VISCERAL (MISCELLANEOUS) ×3 IMPLANT
RTRCTR C-SECT PINK 25CM LRG (MISCELLANEOUS) ×3 IMPLANT
STRIP CLOSURE SKIN 1/2X4 (GAUZE/BANDAGES/DRESSINGS) ×2 IMPLANT
SUT MON AB 2-0 CT1 27 (SUTURE) ×6 IMPLANT
SUT PLAIN 2 0 XLH (SUTURE) ×3 IMPLANT
SUT VIC AB 0 CTX 36 (SUTURE) ×6
SUT VIC AB 0 CTX36XBRD ANBCTRL (SUTURE) ×3 IMPLANT
SUT VIC AB 2-0 CT1 27 (SUTURE) ×2
SUT VIC AB 2-0 CT1 TAPERPNT 27 (SUTURE) ×1 IMPLANT
SUT VIC AB 4-0 KS 27 (SUTURE) ×3 IMPLANT
TOWEL OR 17X24 6PK STRL BLUE (TOWEL DISPOSABLE) ×3 IMPLANT
TRAY FOLEY W/BAG SLVR 14FR LF (SET/KITS/TRAYS/PACK) ×3 IMPLANT
WATER STERILE IRR 1000ML POUR (IV SOLUTION) ×3 IMPLANT

## 2019-09-23 NOTE — Anesthesia Procedure Notes (Addendum)
Spinal  Patient location during procedure: OR Start time: 09/23/2019 10:44 AM End time: 09/23/2019 11:00 AM Staffing Anesthesiologist: Trevor Iha, MD Preanesthetic Checklist Completed: patient identified, IV checked, site marked, risks and benefits discussed, surgical consent, monitors and equipment checked, pre-op evaluation and timeout performed Spinal Block Patient position: sitting Prep: DuraPrep Patient monitoring: cardiac monitor, continuous pulse ox, blood pressure and heart rate Approach: midline Location: L3-4 Injection technique: catheter Needle Needle type: Tuohy and Sprotte  Needle gauge: 24 G Needle length: 12.7 cm Catheter type: closed end flexible Catheter size: 19 g Additional Notes 4 attempts at CSE  Unsuccessful. After speaking with patient through interpreter pt wishes to go to sleep. Further attempts abandoned. Husband informed

## 2019-09-23 NOTE — Transfer of Care (Signed)
Immediate Anesthesia Transfer of Care Note  Patient: Autumn Johnson  Procedure(s) Performed: CESAREAN SECTION WITH BILATERAL TUBAL LIGATION (N/A )  Patient Location: PACU  Anesthesia Type:General  Level of Consciousness: awake  Airway & Oxygen Therapy: Patient Spontanous Breathing  Post-op Assessment: Post -op Vital signs reviewed and stable  Post vital signs: stable  Last Vitals:  Vitals Value Taken Time  BP 108/66 09/23/19 1406  Temp 37.2 C 09/23/19 1406  Pulse 65 09/23/19 1406  Resp 16 09/23/19 1406  SpO2 98 % 09/23/19 1406    Last Pain:  Vitals:   09/23/19 1415  TempSrc:   PainSc: (P) 5          Complications: No complications documented.

## 2019-09-23 NOTE — Anesthesia Postprocedure Evaluation (Signed)
Anesthesia Post Note  Patient: Autumn Johnson  Procedure(s) Performed: CESAREAN SECTION WITH BILATERAL TUBAL LIGATION (N/A )     Patient location during evaluation: PACU Anesthesia Type: General Level of consciousness: awake and alert Pain management: pain level controlled Vital Signs Assessment: post-procedure vital signs reviewed and stable Respiratory status: spontaneous breathing, nonlabored ventilation, respiratory function stable and patient connected to nasal cannula oxygen Cardiovascular status: blood pressure returned to baseline and stable Postop Assessment: no apparent nausea or vomiting Anesthetic complications: no   No complications documented.  Last Vitals:  Vitals:   09/23/19 1356 09/23/19 1406  BP:  108/66  Pulse: 69 65  Resp: 13 16  Temp:  37.2 C  SpO2: 98% 98%    Last Pain:  Vitals:   09/23/19 1415  TempSrc:   PainSc: (P) 5    Pain Goal:                Epidural/Spinal Function Cutaneous sensation: (P) Normal sensation (09/23/19 1415), Patient able to flex knees: (P) Yes (09/23/19 1415), Patient able to lift hips off bed: (P) Yes (09/23/19 1415), Back pain beyond tenderness at insertion site: (P) No (09/23/19 1415), Progressively worsening motor and/or sensory loss: (P) No (09/23/19 1415), Bowel and/or bladder incontinence post epidural: (P) No (09/23/19 1415)  Trevor Iha

## 2019-09-23 NOTE — Op Note (Addendum)
Autumn Johnson  PROCEDURE DATE: 09/23/2019  PREOPERATIVE DIAGNOSIS: Intrauterine pregnancy at  [redacted]w[redacted]d weeks gestation; repeat LTCS (hx of 3 prior)   POSTOPERATIVE DIAGNOSIS: The same  PROCEDURE: repeat Low Transverse Cesarean Section with salpingectomy   SURGEON:  Dr. Candelaria Johnson  ASSISTANT: Dr. Casper Johnson   INDICATIONS Autumn Johnson is a 29 y/o [phone] at [redacted]w[redacted]d scheduled for cesarean section secondary to 3 prior LTCS.  The risks of cesarean section discussed with the patient included but were not limited to: bleeding which may require transfusion or reoperation; infection which may require antibiotics; injury to bowel, bladder, ureters or other surrounding organs; injury to the fetus; need for additional procedures including hysterectomy in the event of a life-threatening hemorrhage; placental abnormalities wth subsequent pregnancies, incisional problems, thromboembolic phenomenon and other postoperative/anesthesia complications. The patient concurred with the proposed plan, giving informed written consent for the procedure.    FINDINGS:  Viable female infant in cephalic presentation.  Apgars 7 and 9, weight, 3525g.  Clear amniotic fluid.  Intact placenta, three vessel cord.  Normal uterus, fallopian tubes and ovaries bilaterally.  ANESTHESIA:    General  INTRAVENOUS FLUIDS:3400 ml ESTIMATED BLOOD LOSS: 2000 ml URINE OUTPUT:  300 ml SPECIMENS: Placenta sent to L&D COMPLICATIONS: postpartum hemorrhage   PROCEDURE IN DETAIL:  The patient received intravenous antibiotics and had sequential compression devices applied to her lower extremities while in the preoperative area.  She was then taken to the operating room where spinal anesthesia was not able to be achieved, and therefore patient was placed under general anesthesia. She was placed in a dorsal supine position with a leftward tilt, and prepped and draped in a sterile manner.  A foley catheter was placed into her bladder and  attached to constant gravity, which drained clear fluid throughout.  After an adequate timeout was performed, a Pfannenstiel skin incision was made with scalpel and carried through to the underlying layer of fascia. The fascia was incised in the midline and this incision was extended bilaterally using blunt technique. Kocher clamps were applied to the superior aspect of the fascial incision and the underlying rectus muscles were dissected off bluntly. A similar process was carried out on the inferior aspect of the facial incision. The rectus muscles were separated in the midline bluntly and the peritoneum was entered bluntly. An Alexis retractor was placed to aid in visualization of the uterus.  Attention was turned to the lower uterine segment where a transverse hysterotomy was made with a scalpel and extended bilaterally bluntly. A large vascular bed was incised causing brisk bleeding. The infant was successfully delivered, and cord was clamped and cut and infant was handed over to awaiting neonatology team. Ring forceps were used to clamp down on briskly bleeding vessel. Uterine massage was then administered and the placenta delivered intact with three-vessel cord. The uterus was then cleared of clot and debris.  The hysterotomy was closed with 0 Vicryl in a running locked fashion and achieved hemostasis of bleeding vessel, and an imbricating layer was also placed with a 0 Vicryl. Overall, excellent hemostasis was noted at this time.   The patient's left fallopian tube was then identified and grasped with a Babcock clamp. The tube was then followed out to the fimbria. The Babcock clamp was then used to grasp the left tube. A Kelly clamp was clamped across the mesosalpinx to incorporate the entire tube. The tube was then excised using Metzenbaum scissors. 2-0 monocryl was then used to tie the underlying mesosalpinx. The right  fallopian tube was then identified and excised in a similar fashion. Excellent  hemostasis was noted, and the tube returned to the abdomen.  The abdomen and the pelvis were cleared of all clot and debris and the Jon Gills was removed. Hemostasis was confirmed on all surfaces.  The peritoneum was reapproximated using 2-0 vicryl running stitches. The fascia was then closed using 0 Vicryl in a running fashion. The subcutaneous layer was reapproximated with plain gut and the skin was closed with 4-0 vicryl. A hemoglobin was checked during the operation and one unit of blood and one gram of TXA were administered. The patient tolerated the procedure well. Sponge, lap, instrument and needle counts were correct x 2. She was taken to the recovery room in stable condition.    Autumn Harrison, MD OB Family Medicine Fellow, [facility], [facility]   Attestation of Attending Supervision of Obstetric Fellow During Surgery: Surgery was performed by the Obstetric Fellow under my supervision and collaboration.  I have reviewed the Obstetric Fellow's operative report, and I agree with the documentation.  I was present and scrubbed for the entire procedure.    Autumn Celeste, DO Attending Physician Faculty Practice, [facility]

## 2019-09-23 NOTE — Anesthesia Procedure Notes (Signed)
Procedure Name: Intubation Date/Time: 09/23/2019 11:31 AM Performed by: Cleda Clarks, CRNA Pre-anesthesia Checklist: Patient identified, Emergency Drugs available, Suction available and Patient being monitored Patient Re-evaluated:Patient Re-evaluated prior to induction Oxygen Delivery Method: Circle system utilized Preoxygenation: Pre-oxygenation with 100% oxygen Induction Type: IV induction Ventilation: Mask ventilation without difficulty Laryngoscope Size: Glidescope and 3 Tube type: Oral Tube size: 7.0 mm Number of attempts: 1 Airway Equipment and Method: Oral airway and Rigid stylet Placement Confirmation: ETT inserted through vocal cords under direct vision,  positive ETCO2 and breath sounds checked- equal and bilateral Secured at: 21 cm Tube secured with: Tape Dental Injury: Teeth and Oropharynx as per pre-operative assessment

## 2019-09-23 NOTE — Discharge Summary (Addendum)
Postpartum Discharge Summary    Patient Name: Autumn Johnson DOB: May 27, 1990 MRN: [id number]  Date of admission: 09/23/2019 Delivery date:09/23/2019  Delivering provider: Truett Mainland  Date of discharge: 09/25/2019  Admitting diagnosis: Cesarean delivery delivered [O82] Intrauterine pregnancy: [redacted]w[redacted]d    Secondary diagnosis:  Principal Problem:   Status post repeat low transverse cesarean section Active Problems:   Language barrier, cultural differences   Supervision of normal pregnancy   Obesity affecting pregnancy   Cesarean delivery delivered  Additional problems: none   Discharge diagnosis: Term Pregnancy Delivered and post op anemia                                              Post partum procedures:blood transfusion and postpartum tubal ligation Augmentation: N/A Complications: [id number] Hospital course: Scheduled C/S   29y.o. yo [id number] admitted to the hospital 09/23/2019 for scheduled cesarean section with the following indication:Elective Repeat.Delivery details are as follows:  Membrane Rupture Time/Date: 11:35 AM ,09/23/2019   Delivery Method:C-Section, Low Transverse  Details of operation can be found in separate operative note, including 1uPRBC and TXA due to EBL of 1875cc.  Patient had a postpartum course remarkable for a post op Hgb of 8.0; given Feraheme.  She is ambulating, tolerating a regular diet, passing flatus, and urinating well. Patient is discharged home in stable condition on  09/25/19        Newborn Data: Birth date:09/23/2019  Birth time:11:35 AM  Gender:Female  Living status:Living  Apgars:7 ,9  Weight:3525 g     Magnesium Sulfate received: No BMZ received: No Rhophylac:N/A MMR:N/A T-DaP:declined at d/c Flu: No Transfusion:Yes  Physical exam  Vitals:   09/24/19 1015 09/24/19 1447 09/24/19 2319 09/25/19 0552  BP: 106/68 106/74 102/70 115/68  Pulse: 72 66 72 79  Resp: _0 Temp: 98.8 F (37.1 C) 98.7 F  (37.1 C) 97.9 F (36.6 C) (!) 97.5 F (36.4 C)  TempSrc: Oral Oral Oral Oral  SpO2: 99% 98% 100% 100%  Weight:      Height:       General: alert, cooperative and no distress Lochia: appropriate Uterine Fundus: firm Incision: Healing well with no significant drainage DVT Evaluation: No evidence of DVT seen on physical exam. Labs: Lab Results  Component Value Date   WBC 9.7 09/24/2019   HGB 8.0 (L) 09/24/2019   HCT 24.1 (L) 09/24/2019   MCV 92.3 09/24/2019   PLT 158 09/24/2019   CMP Latest Ref Rng & Units 09/24/2019  Glucose 65 - 99 mg/dL -  BUN 6 - 20 mg/dL -  Creatinine 0.44 - 1.00 mg/dL 0.63  Sodium 135 - 145 mmol/L -  Potassium 3.5 - 5.1 mmol/L -  Chloride 96 - 106 mmol/L -  CO2 20 - 29 mmol/L -  Calcium 8.7 - 10.2 mg/dL -  Total Protein 6.0 - 8.5 g/dL -  Total Bilirubin 0.0 - 1.2 mg/dL -  Alkaline Phos 48 - 121 IU/L -  AST 0 - 40 IU/L -  ALT 0 - 32 IU/L -   Edinburgh Score: Edinburgh Postnatal Depression Scale Screening Tool 09/23/2019  I have been able to laugh and see the funny side of things. 0  I have looked forward with enjoyment to things. 0  I have blamed myself unnecessarily when things went wrong. 0  I have been anxious or worried for no good reason. 0  I have felt scared or panicky for no good reason. 0  Things have been getting on top of me. 3  I have been so unhappy that I have had difficulty sleeping. 0  I have felt sad or miserable. 0  I have been so unhappy that I have been crying. 0  The thought of harming myself has occurred to me. 0  Edinburgh Postnatal Depression Scale Total 3     After visit meds:  Allergies as of 09/25/2019   No Known Allergies     Medication List    TAKE these medications   acetaminophen 500 MG tablet Commonly known as: TYLENOL Take 500 mg by mouth every 6 (six) hours as needed for moderate pain or headache.   ibuprofen 800 MG tablet Commonly known as: ADVIL Take 1 tablet (800 mg total) by mouth every 8 (eight)  hours as needed for moderate pain.   oxyCODONE 5 MG immediate release tablet Commonly known as: Oxy IR/ROXICODONE Take 1-2 tablets (5-10 mg total) by mouth every 4 (four) hours as needed for moderate pain.   polyethylene glycol powder 17 GM/SCOOP powder Commonly known as: GLYCOLAX/MIRALAX Take 17 g by mouth in the morning and at bedtime. Take twice daily for 7 days   PRENATAL PO Take 1 tablet by mouth daily.        Discharge home in stable condition Infant Feeding: Breast Infant Disposition:home with mother Discharge instruction: per After Visit Summary and Postpartum booklet. Activity: Advance as tolerated. Pelvic rest for 6 weeks.  Diet: routine diet Future Appointments:No future appointments. Follow up Visit:   Please schedule this patient for a In person postpartum visit in 4 weeks with the following provider: Any provider. Additional Postpartum F/U:Incision check 1 week  High risk pregnancy complicated by: multiple c sections, PPH requiring transfusion  Delivery mode:  C-Section, Low Transverse  Anticipated Birth Control:  BTL done   09/25/2019 Autumn Haymaker, MD  CNM attestation I have seen and examined this patient and agree with above documentation in the resident's note.   Autumn Johnson is a 29 y.o. [id number] s/p rLTCS.   Pain is well controlled.  Plan for birth control is bilateral tubal ligation.  Method of Feeding: mostly bottle  PE:  BP 115/68 (BP Location: Right Arm)   Pulse 79   Temp (!) 97.5 F (36.4 C) (Oral)   Resp 18   Ht _0  (1.499 m)   Wt 102.3 kg   LMP 11/16/2018 (Approximate)   SpO2 100%   Breastfeeding Unknown   BMI 45.57 kg/m  Fundus firm  Recent Labs    09/24/19 0513  HGB 8.0*  HCT 24.1*     Plan: discharge today - postpartum care discussed - f/u clinic in 1wk for incision check, then 4 weeks for postpartum visit   Autumn Johnson, CNM 5:07 PM

## 2019-09-23 NOTE — H&P (Signed)
Faculty Practice H&P  Autumn Johnson is a 30 y.o. female [phone] with IUP at [redacted]w[redacted]d presenting for elective repeat cesarean section. Pregnancy was been complicated by 3 prior cesarean section.    Pt states she has been having no contractions, no vaginal bleeding, intact membranes, with normal fetal movement.     Prenatal Course Source of Care: [facility] with onset of care at 31 weeks  Pregnancy complications or risks: Patient Active Problem List   Diagnosis Date Noted  . Obesity affecting pregnancy 06/26/2019  . Supervision of normal pregnancy 04/10/2019  . Obesity 03/05/2016  . Status post repeat low transverse cesarean section 08/05/2015  . Language barrier, cultural differences 08/05/2015   She desires bilateral tubal ligation for contraception.  She plans to breastfeed, plans to bottle feed  Prenatal labs and studies: ABO, Rh: --/--/O POS (08/15 0848) Antibody: NEG (08/15 0848) Rubella: 9.23 (02/04 1557) RPR: NON REACTIVE (08/15 0848)  HBsAg: Negative (02/04 1557)  HIV: NON REACTIVE (08/15 0854)  GBS: Negative/-- (08/02 1440)  1hr Glucola: neg Genetic screening: normal Anatomy US: normal  Past Medical History:  Past Medical History:  Diagnosis Date  . Obesity     Past Surgical History:  Past Surgical History:  Procedure Laterality Date  . CESAREAN SECTION    . CESAREAN SECTION N/A 03/21/2016   Procedure: CESAREAN SECTION;  Surgeon: Hermina Staggers, MD;  Location: [facility];  Service: Obstetrics;  Laterality: N/A;    Obstetrical History:  OB History    Gravida  5   Para  3   Term  3   Preterm      AB  1   Living  3     SAB  1   TAB      Ectopic      Multiple  0   Live Births  3           Gynecological History:  OB History    Gravida  5   Para  3   Term  3   Preterm      AB  1   Living  3     SAB  1   TAB      Ectopic      Multiple  0   Live Births  3           Social History:  Social History    Socioeconomic History  . Marital status: Married    Spouse name: Not on file  . Number of children: Not on file  . Years of education: Not on file  . Highest education level: Not on file  Occupational History  . Not on file  Tobacco Use  . Smoking status: Never Smoker  . Smokeless tobacco: Never Used  Vaping Use  . Vaping Use: Never used  Substance and Sexual Activity  . Alcohol use: No  . Drug use: No  . Sexual activity: Yes  Other Topics Concern  . Not on file  Social History Narrative  . Not on file   Social Determinants of Health   Financial Resource Strain:   . Difficulty of Paying Living Expenses:   Food Insecurity:   . Worried About Programme researcher, broadcasting/film/video in the Last Year:   . Barista in the Last Year:   Transportation Needs:   . Freight forwarder (Medical):   Marland Kitchen Lack of Transportation (Non-Medical):   Physical Activity:   . Days of Exercise per Week:   .  Minutes of Exercise per Session:   Stress:   . Feeling of Stress :   Social Connections:   . Frequency of Communication with Friends and Family:   . Frequency of Social Gatherings with Friends and Family:   . Attends Religious Services:   . Active Member of Clubs or Organizations:   . Attends Banker Meetings:   Marland Kitchen Marital Status:     Family History:  Family History  Problem Relation Age of Onset  . Diabetes Mother        Pre-diabetes  . Hypertension Mother   . Hyperlipidemia Father   . Hyperlipidemia Sister     Medications:  Prenatal vitamins,  Current Facility-Administered Medications  Medication Dose Route Frequency Provider Last Rate Last Admin  . 0.9 %  sodium chloride infusion (Manually program via Guardrails IV Fluids)   Intravenous Once Trevor Iha, MD      . ceFAZolin (ANCEF) IVPB 2g/100 mL premix  2 g Intravenous On Call to OR Levie Heritage, DO      . lactated ringers infusion  125 mL/hr Intravenous Continuous Levie Heritage, DO 125 mL/hr at 09/23/19  1029 125 mL/hr at 09/23/19 1029  . povidone-iodine 10 % swab 2 application  2 application Topical Once Bandy Honaker J, DO      . scopolamine (TRANSDERM-SCOP) 1 MG/3DAYS 1.5 mg  1 patch Transdermal Once Trevor Iha, MD   1.5 mg at 09/23/19 1014    Allergies: No Known Allergies  Review of Systems: - negative  Physical Exam: Blood pressure 109/79, pulse 82, temperature 98.7 F (37.1 C), temperature source Oral, resp. rate 16, height 4\' 11"  (1.499 m), weight 102.3 kg, last menstrual period 11/16/2018, SpO2 99 %. GENERAL: Well-developed, well-nourished female in no acute distress.  LUNGS: Clear to auscultation bilaterally.  HEART: Regular rate and rhythm. ABDOMEN: Soft, nontender, nondistended, gravid. EFW 8 lbs EXTREMITIES: Nontender, no edema, 2+ distal pulses.   Pertinent Labs/Studies:   Lab Results  Component Value Date   WBC 6.5 09/21/2019   HGB 11.7 (L) 09/21/2019   HCT 34.9 (L) 09/21/2019   MCV 92.6 09/21/2019   PLT 189 09/21/2019    Assessment : Autumn Johnson is a 29 y.o. 26 at [redacted]w[redacted]d being admitted for cesarean section with BTL secondary to elective repeat.  Plan: The risks of cesarean section discussed with the patient included but were not limited to: bleeding which may require transfusion or reoperation; infection which may require antibiotics; injury to bowel, bladder, ureters or other surrounding organs; injury to the fetus; need for additional procedures including hysterectomy in the event of a life-threatening hemorrhage; placental abnormalities wth subsequent pregnancies, incisional problems, thromboembolic phenomenon and other postoperative/anesthesia complications. The patient concurred with the proposed plan, giving informed written consent for the procedure.   Patient has been NPO since last night and will remain NPO for procedure.  Preoperative prophylactic Ancef ordered on call to the OR.    [redacted]w[redacted]d, DO 09/23/2019, 10:40 AM

## 2019-09-23 NOTE — Anesthesia Procedure Notes (Signed)
Procedure Name: Intubation Date/Time: 09/23/2019 12:24 PM Performed by: Cleda Clarks, CRNA Pre-anesthesia Checklist: Patient identified, Emergency Drugs available, Suction available, Patient being monitored and Timeout performed Patient Re-evaluated:Patient Re-evaluated prior to induction Oxygen Delivery Method: Circle system utilized Preoxygenation: Pre-oxygenation with 100% oxygen Induction Type: IV induction, Rapid sequence and Cricoid Pressure applied Laryngoscope Size: Glidescope and 3 Grade View: Grade I Tube type: Oral Tube size: 7.0 mm Number of attempts: 1 Airway Equipment and Method: Video-laryngoscopy and Rigid stylet Placement Confirmation: ETT inserted through vocal cords under direct vision,  positive ETCO2,  CO2 detector and breath sounds checked- equal and bilateral Secured at: 22 cm Tube secured with: Tape Dental Injury: Teeth and Oropharynx as per pre-operative assessment

## 2019-09-24 ENCOUNTER — Encounter (HOSPITAL_COMMUNITY): Payer: Self-pay | Admitting: Family Medicine

## 2019-09-24 LAB — CBC
HCT: 24.1 % — ABNORMAL LOW (ref 36.0–46.0)
Hemoglobin: 8 g/dL — ABNORMAL LOW (ref 12.0–15.0)
MCH: 30.7 pg (ref 26.0–34.0)
MCHC: 33.2 g/dL (ref 30.0–36.0)
MCV: 92.3 fL (ref 80.0–100.0)
Platelets: 158 10*3/uL (ref 150–400)
RBC: 2.61 MIL/uL — ABNORMAL LOW (ref 3.87–5.11)
RDW: 15.8 % — ABNORMAL HIGH (ref 11.5–15.5)
WBC: 9.7 10*3/uL (ref 4.0–10.5)
nRBC: 0 % (ref 0.0–0.2)

## 2019-09-24 LAB — CREATININE, SERUM
Creatinine, Ser: 0.63 mg/dL (ref 0.44–1.00)
GFR calc Af Amer: >60 mL/min (ref >60)
GFR calc non Af Amer: >60 mL/min (ref >60)

## 2019-09-24 MED ORDER — SODIUM CHLORIDE 0.9 % IV SOLN
510.0000 mg | Freq: Once | INTRAVENOUS | Status: AC
  Administered 2019-09-24: 510 mg via INTRAVENOUS
  Filled 2019-09-24: qty 17

## 2019-09-24 NOTE — Progress Notes (Signed)
Interpreter used  Subjective: Postpartum Day 1: Cesarean Delivery Doing well overall. Pain well controlled. VB appropriate. Ambulating okay, some difficulty arising from bed. Foley catheter in place. Has not passed gas or had BM yet. Tolerating PO without difficulty.  Objective: Vital signs in last 24 hours: Temp:  [98.2 F (36.8 C)-98.9 F (37.2 C)] 98.5 F (36.9 C) (08/18 0130) Pulse Rate:  [61-82] 76 (08/18 0130) Resp:  [9-22] 17 (08/18 0330) BP: (103-120)/(57-82) 104/74 (08/18 0130) SpO2:  [96 %-99 %] 99 % (08/18 0330) Weight:  [102.3 kg] 102.3 kg (08/17 1004)  Physical Exam:  General: alert, cooperative and no distress Lochia: appropriate Uterine Fundus: firm Incision: wound vac in place, no surrounding erythema or drainage DVT Evaluation: No evidence of DVT seen on physical exam.  Recent Labs    09/21/19 0848 09/23/19 1201  HGB 11.7* 8.2*  HCT 34.9* 24.0*    Assessment/Plan:  POD#1: Status post Cesarean section and BTL. Doing well postoperatively.   -Continue current care  -breastfeeding  -desires circ, ordered/consented  #PPH: EBL , received 1 U pRBC, txa. Asymptomatic, post op hgb 8.0. Feraheme administered.   Plan to discharge home tomorrow or the following day pending progress.  Autumn Johnson 09/24/2019, 5:14 AM

## 2019-09-24 NOTE — Lactation Note (Signed)
This note was copied from a baby's chart. Lactation Consultation Note  Patient Name: Autumn Johnson [id number] Date: 09/24/2019 Reason for consult: Initial assessment;Term  Visited with mom of a 35 hours old FT female, she's a P4 but not very experienced BF. Mom voiced she only BF her other kids for about a week and never got engorged, she believes is because she had a low milk supply. RN Nehemiah Settle put a LC order this afternoon because mom changed her mind and said she was going to try BF at the hospital. She put baby to breast yesterday for about 20-25 minutes however, that was not documented in Flowsheets, mom is a [place] speaker.  Explained to mom that she can change her mind again and put baby to breast if she wishes, but she said she's in too much pain due to her c-section and that she's just going to bottle feed while at the hospital. Pecola Leisure is currently on Similac 20 calorie formula and at 5% weight loss. Reviewed engorgement prevention, LC left brochure with contact information in case she decides to contact Lactation later. LC services no longer needed at this point unless mom request it.   Maternal Data Formula Feeding for Exclusion: Yes Reason for exclusion: Mother's choice to formula and breast feed on admission Has patient been taught Hand Expression?: Yes Does the patient have breastfeeding experience prior to this delivery?: Yes  Feeding Nipple Type: Slow - flow  LATCH Score                   Interventions Interventions: Breast feeding basics reviewed  Lactation Tools Discussed/Used WIC Program: Yes   Consult Status Consult Status: Complete    Autumn Johnson 09/24/2019, 7:16 PM

## 2019-09-25 LAB — BPAM RBC
Blood Product Expiration Date: 202109192359
Blood Product Expiration Date: 202109192359
ISSUE DATE / TIME: 202108171002
ISSUE DATE / TIME: 202108171002
Unit Type and Rh: 5100
Unit Type and Rh: 5100

## 2019-09-25 LAB — TYPE AND SCREEN
ABO/RH(D): O POS
Antibody Screen: NEGATIVE
Unit division: 0
Unit division: 0

## 2019-09-25 LAB — SURGICAL PATHOLOGY

## 2019-09-25 MED ORDER — OXYCODONE HCL 5 MG PO TABS: 5.0000 mg | ORAL_TABLET | ORAL | 0 refills | Status: DC | PRN

## 2019-09-25 MED ORDER — IBUPROFEN 800 MG PO TABS: 800.0000 mg | ORAL_TABLET | Freq: Three times a day (TID) | ORAL | 0 refills | Status: DC | PRN

## 2019-09-25 MED ORDER — POLYETHYLENE GLYCOL 3350 17 GM/SCOOP PO POWD: 17.0000 g | Freq: Two times a day (BID) | ORAL | 0 refills | Status: DC

## 2019-09-25 NOTE — Progress Notes (Signed)
Discharge instructions reviewed with patient and support person. Spanish interpreter used. Patient verbalized understanding of discharge teaching including medications and follow up care.

## 2019-09-26 ENCOUNTER — Telehealth: Payer: Self-pay

## 2019-09-26 NOTE — Telephone Encounter (Signed)
Received phone call from pharmacist regarding oxycodone prescription. Per pharmacist, current rx MME (Morphine milligram equivalent) is greater than 50 and directions need to be changed to give less than 50 MME. Pharmacist states that directions can be changed to 1 tablet every four hours prn or 2 tablets every eight hours to meet this requirement.   To Dr. Homero Fellers (prescribing doctor)  Veronda Prude, RN

## 2019-09-26 NOTE — Telephone Encounter (Signed)
Called and spoke with pharmacy.  Prescription sent through.

## 2019-09-29 ENCOUNTER — Other Ambulatory Visit: Payer: Self-pay

## 2019-09-29 ENCOUNTER — Ambulatory Visit (INDEPENDENT_AMBULATORY_CARE_PROVIDER_SITE_OTHER): Payer: Self-pay | Admitting: Family Medicine

## 2019-09-29 NOTE — Patient Instructions (Addendum)
It was wonderful to see you today.  Please bring ALL of your medications with you to every visit.   - keep your wound clean and dry - You can shower but do not soak the area-- pat dry immediately after shower Do not scrub the site  - replace the pad each day at least 1 time per day   - Call if you have redness, pain or fevers    Thank you for choosing [facility].   Please call [phone] with any questions about today's appointment.  Please be sure to schedule follow up at the front  desk before you leave today.    Mirian Mo, MD  Terisa Starr, MD  Family Medicine

## 2019-09-29 NOTE — Progress Notes (Signed)
  [facility] Postpartum Visit   Autumn Johnson is a 29 y.o. [id number] presenting for a postpartum visit.  She has the following concerns today: itching, burning around incision site.  She also noted some dull throbbing in her left lower abdomen.  She did have one episode of nausea and vomiting since leaving the hospital but thinks it was related to taking oxycodone. She delivered via rLTCS at [facility] followed by a bilateral tubal ligation.  She reports her vaginal bleeding is totally improved. [name] her infant. She feels she is bonding well. She has had a BTL for contraception.   She was not given an [place] depression score but rather a PHQ-9.  She scored a total of 0 on her PHQ9. -  Vitals:   09/29/19 1335  BP: 96/60  Pulse: 80  SpO2: 99%    A/P:  Postpartum visit: patient is 1 weeks postpartum following a repeat low transverse cesarean section delivery. -Discussed patients delivery and complications -Patient has urinary incontinence? No  -Patient does not want a pregnancy in the next year.   -Return to sexual activity and contraception discussed as above.  -Breastfeeding: No, provided support of decision and resources as indicated  -Mood: appropriate  -Discussed sleep and fatigue management and encouraged family/community support.  -Postpartum vaccines: none -Need for postpartum diabetes screening:  none   -Reviewed prior Pap and she is next due for Pap 2024.   -Wound VAC over surgical site: She reported that she had no scheduled follow-up with an obstetrician.  After consulting with the OB attending on call, the wound VAC was turned off and the adhesive and foam removed.  There were no Steri-Strips or additional bandages underneath.  The wound appeared well-healing without signs of infection.  She did have several small areas of hypersensitivity to the adhesive and was recommended to apply petroleum jelly to avoid irritation to these  areas.   -Return to clinic in 1 week for an incision check

## 2019-10-03 ENCOUNTER — Other Ambulatory Visit: Payer: Self-pay

## 2019-10-03 ENCOUNTER — Ambulatory Visit (INDEPENDENT_AMBULATORY_CARE_PROVIDER_SITE_OTHER): Payer: Self-pay | Admitting: Family Medicine

## 2019-10-03 DIAGNOSIS — Z98891 History of uterine scar from previous surgery: Secondary | ICD-10-CM

## 2019-10-03 NOTE — Progress Notes (Signed)
    SUBJECTIVE:   CHIEF COMPLAINT / HPI:   C-section incision check Ms. Forquer was seen in clinic today for 1 week follow-up for her incision check.  The wound VAC was removed from her C-section incision at her last appointment and revealed a well-healing wound still in the early stages of healing.  She was asked to return to clinic to ensure there were no complications.  She denies any bleeding or white discharge from the wound.  She also denies nausea, stomach pain, vomiting, fever.  Overall, she feels well.  She was wondering if she should have any weight lifting restrictions with her surgery.  She also wanted to know if it was normal for her to have a small amount of red/brown vaginal discharge at this point following her delivery.    PERTINENT  PMH / PSH: G5 P4-0-1-4  OBJECTIVE:   BP 102/66   Pulse 85   Ht 4\' 11"  (1.499 m)   Wt 207 lb 8 oz (94.1 kg)   LMP 11/16/2018 (Approximate)   SpO2 98%   BMI 41.91 kg/m    General: Well-appearing woman.  Sitting comfortably in the exam chair, caring for her infant son.  Able to walk around the room comfortably without any evidence of discomfort or abdominal pain. Respiratory: Breathing comfortably on room air.  No respiratory distress. Abdomen: Well-healing incision without evidence of bleeding or purulent discharge.  No significant tenderness to palpation.  ASSESSMENT/PLAN:   Status post repeat low transverse cesarean section Wound appears well-healing without signs of infection.  She is informed that she does not need any additional follow-ups for incision at this time.  She is encouraged to limit her weightlifting to about 10 pounds.  She was told not to lift anything heavier than her baby carrier.  She was also informed that a small amount of vaginal bleeding is totally normal as long as she is not having any abdominal pain, nausea, vomiting, fever.  She is encouraged to return to clinic at 4-6 weeks postpartum for her final  postpartum visit.     01/16/2019, MD [facility]

## 2019-10-03 NOTE — Patient Instructions (Signed)
C-Section: you incision looks like it is well healing.  It does need a little bit more time to heal totally.  Please come back to clinic at 4-6 weeks from your delivery.  The vaginal bleeding you are describing is normal at this time.  Cesrea: parece que la incisin est sanando bien. Necesita un poco ms de tiempo para curarse por completo. Regrese a la [employer] 4 y 6 semanas despus del parto. El sangrado vaginal que est describiendo es normal en este momento.

## 2019-10-03 NOTE — Assessment & Plan Note (Signed)
Wound appears well-healing without signs of infection.  She is informed that she does not need any additional follow-ups for incision at this time.  She is encouraged to limit her weightlifting to about 10 pounds.  She was told not to lift anything heavier than her baby carrier.  She was also informed that a small amount of vaginal bleeding is totally normal as long as she is not having any abdominal pain, nausea, vomiting, fever.  She is encouraged to return to clinic at 4-6 weeks postpartum for her final postpartum visit.

## 2019-10-10 ENCOUNTER — Encounter: Payer: Self-pay | Admitting: General Practice

## 2019-10-14 ENCOUNTER — Ambulatory Visit: Payer: Self-pay | Admitting: Family Medicine

## 2019-10-15 ENCOUNTER — Other Ambulatory Visit: Payer: Self-pay

## 2019-10-15 ENCOUNTER — Ambulatory Visit (INDEPENDENT_AMBULATORY_CARE_PROVIDER_SITE_OTHER): Payer: Self-pay | Admitting: Family Medicine

## 2019-10-15 VITALS — BP 100/72 | HR 72 | Wt 207.0 lb

## 2019-10-15 DIAGNOSIS — R239 Unspecified skin changes: Secondary | ICD-10-CM

## 2019-10-15 DIAGNOSIS — B372 Candidiasis of skin and nail: Secondary | ICD-10-CM

## 2019-10-15 MED ORDER — SILVER NITRATE-POT NITRATE 75-25 % EX MISC: 1.0000 | Freq: Once | CUTANEOUS | Status: AC

## 2019-10-15 MED ORDER — NYSTATIN 100000 UNIT/GM EX CREA: 1.0000 "application " | TOPICAL_CREAM | Freq: Two times a day (BID) | CUTANEOUS | 0 refills | Status: DC

## 2019-10-15 NOTE — Progress Notes (Addendum)
° ° °  SUBJECTIVE:   CHIEF COMPLAINT / HPI:   Autumn Johnson presents to ATC for the issue below.   Incision check Cesarean 3 weeks ago. Experienced oozing and odor (indescribable) 1 week ago. She has an associated burning sensation but denies pain at the site. She does not feel this is normal because this did not occur with her prior c-sections.   PERTINENT  PMH / PSH: [id number], Obesity  OBJECTIVE:   BP 100/72    Pulse 72    Wt 207 lb (93.9 kg)    LMP 11/16/2018 (Approximate)    SpO2 99%    BMI 41.81 kg/m   General: Appears well, no acute distress. Age appropriate. Skin: Incision site under large pannus. Raise erythematous candida appearing rashes in close positivity to site. Incision with what appears to be breaks in skin. No oozing or odor appreciated at this time.            ASSESSMENT/PLAN:   Candidal intertrigo Acute. Skin with noticeable erythematous irritation. Burning. Likely yeast skin infection due to pannus.  -Keep area clean and dry -Nystatin cream for areas of irritation -Follow up in 1 week if fails to improve  Alteration in skin integrity related to surgical incision Acute. 1-2cm tissue exposed at the left lateral side of incision. It is non infectious appear. Erythematous but without bleeding. Review Dr. Cardell Peach note 8/27 and incision appeared to be healing normal at that time.  Granduloma v. Endometriosis. Discussed with patient using silver nitrate for possible granduloma. Understanding she will need to follow up in 1 week to check site again to see if we need to repeat using silver nitrate. Also discussed following up with OBGYN for possible endometriosis at the surgical site. Gave her the number to call the office that performed the c-section and ask her to make an appointment as soon as possible.  -Nystatin cream as above.  -Silver nitrate today was tolerated well with minimal bleeding post application -Follow up with OBGYN -Follow up with [facility] in 1 week to  check site   Autumn Jumbo, DO [facility]   *[facility] Spanish interpreter used during entire encounter [place] [phone]

## 2019-10-15 NOTE — Patient Instructions (Addendum)
Today you were seen for an incision check.  It looks as if there may be a yeast infection around the site.   I will give you nystatin ointment to be used 2 times daily.   If this does not improve within 2 weeks come back to be seen.   It can also be granulation tissue for this we are going to use silver nitrate. Please follow up in 1 week if necessary.   We will also consider the possibility of endometriosis at the incision sit. We will refer you to the OBGYN.  Dr. Salvadore Dom

## 2019-10-19 DIAGNOSIS — B372 Candidiasis of skin and nail: Secondary | ICD-10-CM | POA: Insufficient documentation

## 2019-10-19 DIAGNOSIS — R239 Unspecified skin changes: Secondary | ICD-10-CM | POA: Insufficient documentation

## 2019-10-19 NOTE — Assessment & Plan Note (Signed)
Acute. 1-2cm tissue exposed at the left lateral side of incision. It is non infectious appear. Erythematous but without bleeding. Review Dr. Cardell Peach note 8/27 and incision appeared to be healing normal at that time.  Granduloma v. Endometriosis. Discussed with patient using silver nitrate for possible granduloma. Understanding she will need to follow up in 1 week to check site again to see if we need to repeat using silver nitrate. Also discussed following up with OBGYN for possible endometriosis at the surgical site. Gave her the number to call the office that performed the c-section and ask her to make an appointment as soon as possible.  -Nystatin cream as above.  -Silver nitrate today was tolerated well with minimal bleeding post application -Follow up with OBGYN -Follow up with [facility] in 1 week to check site

## 2019-10-19 NOTE — Assessment & Plan Note (Signed)
Acute. Skin with noticeable erythematous irritation. Burning. Likely yeast skin infection due to pannus.  -Keep area clean and dry -Nystatin cream for areas of irritation -Follow up in 1 week if fails to improve

## 2019-10-27 ENCOUNTER — Other Ambulatory Visit: Payer: Self-pay

## 2019-10-27 ENCOUNTER — Encounter: Payer: Self-pay | Admitting: Family Medicine

## 2019-10-27 ENCOUNTER — Ambulatory Visit (INDEPENDENT_AMBULATORY_CARE_PROVIDER_SITE_OTHER): Payer: Self-pay | Admitting: Family Medicine

## 2019-10-27 VITALS — BP 100/70 | HR 65 | Ht 59.0 in | Wt 206.0 lb

## 2019-10-27 DIAGNOSIS — B372 Candidiasis of skin and nail: Secondary | ICD-10-CM

## 2019-10-27 NOTE — Assessment & Plan Note (Signed)
Resolved. -Patient can continue topical occasions as needed at C-section incision continues to heal

## 2019-10-27 NOTE — Progress Notes (Addendum)
    SUBJECTIVE:   Encounter was interpreted without of Spanish interpreter [phone]  CHIEF COMPLAINT / HPI:   Follow-up C-section incision condition: Patient was seen 1 week ago with concerns for changes to her C-section incision after C-section 3 weeks prior.  Was given silver nitrate and nystatin topical for concerns for candidal intertrigo.  Patient reports she is doing much better.  Denies any concerns with the incision site.  Reports is completely back to normal.  PERTINENT  PMH / PSH:  Patient Active Problem List   Diagnosis Date Noted  . Candidal intertrigo 10/19/2019  . Alteration in skin integrity related to surgical incision 10/19/2019  . Cesarean delivery delivered 09/23/2019  . Obesity affecting pregnancy 06/26/2019  . Supervision of normal pregnancy 04/10/2019  . Obesity 03/05/2016  . Status post repeat low transverse cesarean section 08/05/2015  . Language barrier, cultural differences 08/05/2015     OBJECTIVE:   BP 100/70   Pulse 65   Ht 4\' 11"  (1.499 m)   Wt 206 lb (93.4 kg)   LMP 11/14/2018   SpO2 98%   BMI 41.61 kg/m    Physical exam: General: Well-appearing patient, no apparent distress, nontoxic appearing Respiratory: Gradual work of breathing, speaking complete sentences Integumentary: See photos below            C-section incision 10/15/2019 with candidal intertrigo C-section incision site after treatment with nystatin, silver nitrate   ASSESSMENT/PLAN:   Candidal intertrigo Resolved. -Patient can continue topical occasions as needed at C-section incision continues to heal     12/15/2019, DO [facility]

## 2019-12-08 ENCOUNTER — Other Ambulatory Visit: Payer: Self-pay

## 2019-12-08 ENCOUNTER — Encounter: Payer: Self-pay | Admitting: Obstetrics and Gynecology

## 2019-12-08 ENCOUNTER — Ambulatory Visit (INDEPENDENT_AMBULATORY_CARE_PROVIDER_SITE_OTHER): Payer: Self-pay | Admitting: Obstetrics and Gynecology

## 2019-12-08 VITALS — BP 109/69 | HR 66 | Wt 199.2 lb

## 2019-12-08 DIAGNOSIS — R239 Unspecified skin changes: Secondary | ICD-10-CM

## 2019-12-08 NOTE — Progress Notes (Signed)
[facility] Interpreter # [phone]

## 2019-12-08 NOTE — Patient Instructions (Signed)
Mantenimiento de la salud en las mujeres Health Maintenance, Female Adoptar un estilo de vida saludable y recibir atencin preventiva son importantes para promover la salud y el bienestar. Consulte al mdico sobre:  El esquema adecuado para hacerse pruebas y exmenes peridicos.  Cosas que puede hacer por su cuenta para prevenir enfermedades y mantenerse sana. Qu debo saber sobre la dieta, el peso y el ejercicio? Consuma una dieta saludable   Consuma una dieta que incluya muchas verduras, frutas, productos lcteos con bajo contenido de grasa y protenas magras.  No consuma muchos alimentos ricos en grasas slidas, azcares agregados o sodio. Mantenga un peso saludable El ndice de masa muscular (IMC) se utiliza para identificar problemas de peso. Proporciona una estimacin de la grasa corporal basndose en el peso y la altura. Su mdico puede ayudarle a determinar su IMC y a lograr o mantener un peso saludable. Haga ejercicio con regularidad Haga ejercicio con regularidad. Esta es una de las prcticas ms importantes que puede hacer por su salud. La mayora de los adultos deben seguir estas pautas:  Realizar, al menos, 150minutos de actividad fsica por semana. El ejercicio debe aumentar la frecuencia cardaca y hacerlo transpirar (ejercicio de intensidad moderada).  Hacer ejercicios de fortalecimiento por lo menos dos veces por semana. Agregue esto a su plan de ejercicio de intensidad moderada.  Pasar menos tiempo sentados. Incluso la actividad fsica ligera puede ser beneficiosa. Controle sus niveles de colesterol y lpidos en la sangre Comience a realizarse anlisis de lpidos y colesterol en la sangre a los 20aos y luego reptalos cada 5aos. Hgase controlar los niveles de colesterol con mayor frecuencia si:  Sus niveles de lpidos y colesterol son altos.  Es mayor de 40aos.  Presenta un alto riesgo de padecer enfermedades cardacas. Qu debo saber sobre las pruebas de  deteccin del cncer? Segn su historia clnica y sus antecedentes familiares, es posible que deba realizarse pruebas de deteccin del cncer en diferentes edades. Esto puede incluir pruebas de deteccin de lo siguiente:  Cncer de mama.  Cncer de cuello uterino.  Cncer colorrectal.  Cncer de piel.  Cncer de pulmn. Qu debo saber sobre la enfermedad cardaca, la diabetes y la hipertensin arterial? Presin arterial y enfermedad cardaca  La hipertensin arterial causa enfermedades cardacas y aumenta el riesgo de accidente cerebrovascular. Es ms probable que esto se manifieste en las personas que tienen lecturas de presin arterial alta, tienen ascendencia africana o tienen sobrepeso.  Hgase controlar la presin arterial: ? Cada 3 a 5 aos si tiene entre 18 y 39 aos. ? Todos los aos si es mayor de 40aos. Diabetes Realcese exmenes de deteccin de la diabetes con regularidad. Este anlisis revisa el nivel de azcar en la sangre en ayunas. Hgase las pruebas de deteccin:  Cada tresaos despus de los 40aos de edad si tiene un peso normal y un bajo riesgo de padecer diabetes.  Con ms frecuencia y a partir de una edad inferior si tiene sobrepeso o un alto riesgo de padecer diabetes. Qu debo saber sobre la prevencin de infecciones? Hepatitis B Si tiene un riesgo ms alto de contraer hepatitis B, debe someterse a un examen de deteccin de este virus. Hable con el mdico para averiguar si tiene riesgo de contraer la infeccin por hepatitis B. Hepatitis C Se recomienda el anlisis a:  Todos los que nacieron entre 1945 y 1965.  Todas las personas que tengan un riesgo de haber contrado hepatitis C. Enfermedades de transmisin sexual (ETS)  Hgase las   pruebas de deteccin de ITS, incluidas la gonorrea y la clamidia, si: ? Es sexualmente activa y es menor de 24aos. ? Es mayor de 24aos, y el mdico le informa que corre riesgo de tener este tipo de  infecciones. ? La actividad sexual ha cambiado desde que le hicieron la ltima prueba de deteccin y tiene un riesgo mayor de tener clamidia o gonorrea. Pregntele al mdico si usted tiene riesgo.  Pregntele al mdico si usted tiene un alto riesgo de contraer VIH. El mdico tambin puede recomendarle un medicamento recetado para ayudar a evitar la infeccin por el VIH. Si elige tomar medicamentos para prevenir el VIH, primero debe hacerse los anlisis de deteccin del VIH. Luego debe hacerse anlisis cada 3meses mientras est tomando los medicamentos. Embarazo  Si est por dejar de menstruar (fase premenopusica) y usted puede quedar embarazada, busque asesoramiento antes de quedar embarazada.  Tome de 400 a 800microgramos (mcg) de cido flico todos los das si queda embarazada.  Pida mtodos de control de la natalidad (anticonceptivos) si desea evitar un embarazo no deseado. Osteoporosis y menopausia La osteoporosis es una enfermedad en la que los huesos pierden los minerales y la fuerza por el avance de la edad. El resultado pueden ser fracturas en los huesos. Si tiene 65aos o ms, o si est en riesgo de sufrir osteoporosis y fracturas, pregunte a su mdico si debe:  Hacerse pruebas de deteccin de prdida sea.  Tomar un suplemento de calcio o de vitamina D para reducir el riesgo de fracturas.  Recibir terapia de reemplazo hormonal (TRH) para tratar los sntomas de la menopausia. Siga estas instrucciones en su casa: Estilo de vida  No consuma ningn producto que contenga nicotina o tabaco, como cigarrillos, cigarrillos electrnicos y tabaco de mascar. Si necesita ayuda para dejar de fumar, consulte al mdico.  No consuma drogas.  No comparta agujas.  Solicite ayuda a su mdico si necesita apoyo o informacin para abandonar las drogas. Consumo de alcohol  No beba alcohol si: ? Su mdico le indica no hacerlo. ? Est embarazada, puede estar embarazada o est tratando de quedar  embarazada.  Si bebe alcohol: ? Limite la cantidad que consume de 0 a 1 medida por da. ? Limite la ingesta si est amamantando.  Est atento a la cantidad de alcohol que hay en las bebidas que toma. En [place], una medida equivale a una botella de cerveza de 12oz (355ml), un vaso de vino de 5oz (148ml) o un vaso de una bebida alcohlica de alta graduacin de 1oz (44ml). Instrucciones generales  Realcese los estudios de rutina de la salud, dentales y de la vista.  Mantngase al da con las vacunas.  Infrmele a su mdico si: ? Se siente deprimida con frecuencia. ? Alguna vez ha sido vctima de maltrato o no se siente segura en su casa. Resumen  Adoptar un estilo de vida saludable y recibir atencin preventiva son importantes para promover la salud y el bienestar.  Siga las instrucciones del mdico acerca de una dieta saludable, el ejercicio y la realizacin de pruebas o exmenes para detectar enfermedades.  Siga las instrucciones del mdico con respecto al control del colesterol y la presin arterial. Esta informacin no tiene como fin reemplazar el consejo del mdico. Asegrese de hacerle al mdico cualquier pregunta que tenga. Document Revised: 02/13/2018 Document Reviewed: 02/13/2018 Elsevier Patient Education  2020 Elsevier Inc.  

## 2019-12-08 NOTE — Progress Notes (Signed)
Patient ID: Autumn Johnson, female   DOB: 07-07-1990, 29 y.o.   MRN: [id number] Autumn Johnson was referred by [facility] for evaluation of c section incision. C section 09/23/19. Seen 10/15/19 by [facility] with probable yeast infection at c section incision. Tx with Nystation and silver nitrate There was a question of endometriosis as well. F/U at [facility] 2 weeks later, Sx had completely resolved  Pt has no complaints today. Has had a normal cycle since delivery. Denies any pelvic or abd pain. Denies any bowel or bladder dysfunction.  PE AF VSS Lungs clear Heart RRR Abd soft + BS incision well healed non tender  A/P Normal exam of c section incision. No evidence of endometriosis by H & P. Discussed with pt. ADL's  As tolerates F/U with [place] PRN Live interrupter used during today's visit

## 2020-05-27 ENCOUNTER — Emergency Department (HOSPITAL_COMMUNITY): Payer: Self-pay

## 2020-05-27 DIAGNOSIS — S46911A Strain of unspecified muscle, fascia and tendon at shoulder and upper arm level, right arm, initial encounter: Secondary | ICD-10-CM | POA: Insufficient documentation

## 2020-05-27 DIAGNOSIS — X500XXA Overexertion from strenuous movement or load, initial encounter: Secondary | ICD-10-CM | POA: Insufficient documentation

## 2020-05-27 LAB — COMPREHENSIVE METABOLIC PANEL
ALT: 25 U/L (ref 0–44)
AST: 26 U/L (ref 15–41)
Albumin: 4.3 g/dL (ref 3.5–5.0)
Alkaline Phosphatase: 72 U/L (ref 38–126)
Anion gap: 8 (ref 5–15)
BUN: 20 mg/dL (ref 6–20)
CO2: 24 mmol/L (ref 22–32)
Calcium: 9.4 mg/dL (ref 8.9–10.3)
Chloride: 104 mmol/L (ref 98–111)
Creatinine, Ser: 0.48 mg/dL (ref 0.44–1.00)
GFR, Estimated: >60 mL/min (ref >60)
Glucose, Bld: 97 mg/dL (ref 70–99)
Potassium: 3.8 mmol/L (ref 3.5–5.1)
Sodium: 136 mmol/L (ref 135–145)
Total Bilirubin: 0.3 mg/dL (ref 0.3–1.2)
Total Protein: 7.9 g/dL (ref 6.5–8.1)

## 2020-05-27 LAB — CBC WITH DIFFERENTIAL/PLATELET
Abs Immature Granulocytes: 0.04 10*3/uL (ref 0.00–0.07)
Basophils Absolute: 0 10*3/uL (ref 0.0–0.1)
Basophils Relative: 1 %
Eosinophils Absolute: 0.1 10*3/uL (ref 0.0–0.5)
Eosinophils Relative: 2 %
HCT: 44 % (ref 36.0–46.0)
Hemoglobin: 14.5 g/dL (ref 12.0–15.0)
Immature Granulocytes: 1 %
Lymphocytes Relative: 28 %
Lymphs Abs: 2.3 10*3/uL (ref 0.7–4.0)
MCH: 31.3 pg (ref 26.0–34.0)
MCHC: 33 g/dL (ref 30.0–36.0)
MCV: 95 fL (ref 80.0–100.0)
Monocytes Absolute: 0.3 10*3/uL (ref 0.1–1.0)
Monocytes Relative: 4 %
Neutro Abs: 5.6 10*3/uL (ref 1.7–7.7)
Neutrophils Relative %: 64 %
Platelets: 219 10*3/uL (ref 150–400)
RBC: 4.63 MIL/uL (ref 3.87–5.11)
RDW: 12.9 % (ref 11.5–15.5)
WBC: 8.5 10*3/uL (ref 4.0–10.5)
nRBC: 0 % (ref 0.0–0.2)

## 2020-05-27 LAB — TROPONIN I (HIGH SENSITIVITY): Troponin I (High Sensitivity): <2 ng/L (ref <18)

## 2020-05-27 LAB — LIPASE, BLOOD: Lipase: 31 U/L (ref 11–51)

## 2020-05-27 MED ORDER — METHOCARBAMOL 500 MG PO TABS
750.0000 mg | ORAL_TABLET | Freq: Once | ORAL | Status: AC
  Administered 2020-05-28: 750 mg via ORAL
  Filled 2020-05-27: qty 2

## 2020-05-27 NOTE — ED Triage Notes (Signed)
Pt states she was taking a walk in the park when her R chest and R arm started to hurt. She states she got the sudden urge to urinate then started to get this pain. Pt states it started at 2015. She states it is intermittent

## 2020-05-27 NOTE — ED Provider Notes (Signed)
MSE was initiated and I personally evaluated the patient and placed orders (if any) at  10:14 PM on May 27, 2020.  Patient to ED for evaluation of right chest pain through to the back and to the right arm and hand that started while she was walking in the park around 8:30 pm tonight. No SOB, cough, fever, nausea, vomiting. It has been constant since it started, but improving over time. She has not taken anything for symptoms.   Today's Vitals   05/27/20 2202 05/27/20 2210  BP: 132/84   Pulse: 94   Resp: 19   Temp: 98.3 F (36.8 C)   TempSrc: Oral   SpO2: 100%   Weight: 92.1 kg   Height: 4\' 11"  (1.499 m)   PainSc:  4    Body mass index is 41 kg/m.  Obese patient Lungs clear, RRR No neck tenderness.  The patient appears stable so that the remainder of the MSE may be completed by another provider.   , PA-C 05/27/20 2216    2217, MD 05/28/20 1414

## 2020-05-28 ENCOUNTER — Encounter (HOSPITAL_COMMUNITY): Payer: Self-pay | Admitting: Emergency Medicine

## 2020-05-28 ENCOUNTER — Emergency Department (HOSPITAL_COMMUNITY)
Admission: EM | Admit: 2020-05-28 | Discharge: 2020-05-28 | Disposition: A | Discharge status: Home / Self Care | Payer: Self-pay | Attending: Emergency Medicine | Admitting: Emergency Medicine

## 2020-05-28 DIAGNOSIS — T148XXA Other injury of unspecified body region, initial encounter: Secondary | ICD-10-CM

## 2020-05-28 MED ORDER — IBUPROFEN 800 MG PO TABS
800.0000 mg | ORAL_TABLET | Freq: Once | ORAL | Status: AC
  Administered 2020-05-28: 800 mg via ORAL
  Filled 2020-05-28: qty 1

## 2020-05-28 MED ORDER — ACETAMINOPHEN 500 MG PO TABS
1000.0000 mg | ORAL_TABLET | Freq: Once | ORAL | Status: AC
  Administered 2020-05-28: 1000 mg via ORAL
  Filled 2020-05-28: qty 2

## 2020-05-28 NOTE — ED Provider Notes (Signed)
[facility]-EMERGENCY DEPT Provider Note   CSN: [id number] Arrival date & time: 05/27/20  2155     History Chief Complaint  Patient presents with  . Chest Pain  . Arm Pain    Autumn Johnson is a 30 y.o. female.  The history is provided by the patient.  Arm Pain This is a new problem. The current episode started 6 to 12 hours ago. The problem occurs constantly. The problem has not changed (pain with pushing stroller and movement and radiations to the shoulder R upper chest area ) since onset.Pertinent negatives include no chest pain, no abdominal pain, no headaches and no shortness of breath. Nothing aggravates the symptoms. Nothing relieves the symptoms. She has tried nothing for the symptoms. The treatment provided no relief.  Pain in the RUE into the chest following pushing a stroller and lifting a baby.  Worse with movement and palpation.  No SOB, no DOE.  No n/v/d.  No travel, no OCP. No leg pain      Past Medical History:  Diagnosis Date  . Obesity     Patient Active Problem List   Diagnosis Date Noted  . Alteration in skin integrity related to surgical incision 10/19/2019  . Cesarean delivery delivered 09/23/2019  . Obesity affecting pregnancy 06/26/2019  . Supervision of normal pregnancy 04/10/2019  . Obesity 03/05/2016  . Status post repeat low transverse cesarean section 08/05/2015  . Language barrier, cultural differences 08/05/2015    Past Surgical History:  Procedure Laterality Date  . CESAREAN SECTION    . CESAREAN SECTION N/A 03/21/2016   Procedure: CESAREAN SECTION;  Surgeon: Hermina Staggers, MD;  Location: [facility] BIRTHING SUITES;  Service: Obstetrics;  Laterality: N/A;  . CESAREAN SECTION WITH BILATERAL TUBAL LIGATION N/A 09/23/2019   Procedure: CESAREAN SECTION WITH BILATERAL TUBAL LIGATION;  Surgeon: Levie Heritage, DO;  Location: [facility] LD ORS;  Service: Obstetrics;  Laterality: N/A;     OB History    Gravida  5   Para  4   Term   4   Preterm      AB  1   Living  4     SAB  1   IAB      Ectopic      Multiple  0   Live Births  4           Family History  Problem Relation Age of Onset  . Diabetes Mother        Pre-diabetes  . Hypertension Mother   . Hyperlipidemia Father   . Hyperlipidemia Sister     Social History   Tobacco Use  . Smoking status: Never Smoker  . Smokeless tobacco: Never Used  Vaping Use  . Vaping Use: Never used  Substance Use Topics  . Alcohol use: No  . Drug use: No    Home Medications Prior to Admission medications   Not on File    Allergies    Patient has no known allergies.  Review of Systems   Review of Systems  Constitutional: Negative for fever.  HENT: Negative for congestion.   Eyes: Negative for visual disturbance.  Respiratory: Negative for shortness of breath.   Cardiovascular: Negative for chest pain.  Gastrointestinal: Negative for abdominal pain.  Genitourinary: Negative for difficulty urinating.  Musculoskeletal: Negative for back pain.  Skin: Negative for rash.  Neurological: Negative for headaches.  Psychiatric/Behavioral: Negative for agitation.  All other systems reviewed and are negative.   Physical Exam Updated  Vital Signs BP 113/78   Pulse 72   Temp 98.3 F (36.8 C) (Oral)   Resp 16   Ht 4\' 11"  (1.499 m)   Wt 92.1 kg   LMP 03/29/2020 (Within Weeks)   SpO2 98%   BMI 41.00 kg/m   Physical Exam Vitals and nursing note reviewed.  Constitutional:      General: She is not in acute distress.    Appearance: Normal appearance.  HENT:     Head: Normocephalic and atraumatic.     Nose: Nose normal.  Eyes:     Conjunctiva/sclera: Conjunctivae normal.     Pupils: Pupils are equal, round, and reactive to light.  Cardiovascular:     Rate and Rhythm: Normal rate and regular rhythm.     Pulses: Normal pulses.     Heart sounds: Normal heart sounds.  Pulmonary:     Effort: Pulmonary effort is normal.     Breath sounds:  Normal breath sounds.  Abdominal:     General: Abdomen is flat. Bowel sounds are normal.     Palpations: Abdomen is soft.     Tenderness: There is no abdominal tenderness. There is no guarding.  Musculoskeletal:        General: Normal range of motion.     Right shoulder: Tenderness present. No swelling, deformity, effusion, laceration, bony tenderness or crepitus. Normal range of motion. Normal strength. Normal pulse.     Right upper arm: Tenderness present. No swelling, edema, deformity or lacerations.     Right elbow: Normal.     Cervical back: Normal range of motion and neck supple.     Right lower leg: No edema.     Left lower leg: No edema.     Comments: Negative NEERS test R   Skin:    General: Skin is warm and dry.     Capillary Refill: Capillary refill takes less than 2 seconds.  Neurological:     General: No focal deficit present.     Mental Status: She is alert and oriented to person, place, and time.     Deep Tendon Reflexes: Reflexes normal.  Psychiatric:        Mood and Affect: Mood normal.        Behavior: Behavior normal.     ED Results / Procedures / Treatments   Labs (all labs ordered are listed, but only abnormal results are displayed) Results for orders placed or performed during the hospital encounter of 05/28/20  CBC with Differential  Result Value Ref Range   WBC 8.5 4.0 - 10.5 K/uL   RBC 4.63 3.87 - 5.11 MIL/uL   Hemoglobin 14.5 12.0 - 15.0 g/dL   HCT 05/30/20 54.6 - 56.8 %   MCV 95.0 80.0 - 100.0 fL   MCH 31.3 26.0 - 34.0 pg   MCHC 33.0 30.0 - 36.0 g/dL   RDW 12.7 51.7 - 00.1 %   Platelets 219 150 - 400 K/uL   nRBC 0.0 0.0 - 0.2 %   Neutrophils Relative % 64 %   Neutro Abs 5.6 1.7 - 7.7 K/uL   Lymphocytes Relative 28 %   Lymphs Abs 2.3 0.7 - 4.0 K/uL   Monocytes Relative 4 %   Monocytes Absolute 0.3 0.1 - 1.0 K/uL   Eosinophils Relative 2 %   Eosinophils Absolute 0.1 0.0 - 0.5 K/uL   Basophils Relative 1 %   Basophils Absolute 0.0 0.0 - 0.1 K/uL    Immature Granulocytes 1 %   Abs Immature  Granulocytes 0.04 0.00 - 0.07 K/uL  Comprehensive metabolic panel  Result Value Ref Range   Sodium 136 135 - 145 mmol/L   Potassium 3.8 3.5 - 5.1 mmol/L   Chloride 104 98 - 111 mmol/L   CO2 24 22 - 32 mmol/L   Glucose, Bld 97 70 - 99 mg/dL   BUN 20 6 - 20 mg/dL   Creatinine, Ser 7.61 0.44 - 1.00 mg/dL   Calcium 9.4 8.9 - 95.0 mg/dL   Total Protein 7.9 6.5 - 8.1 g/dL   Albumin 4.3 3.5 - 5.0 g/dL   AST 26 15 - 41 U/L   ALT 25 0 - 44 U/L   Alkaline Phosphatase 72 38 - 126 U/L   Total Bilirubin 0.3 0.3 - 1.2 mg/dL   GFR, Estimated >93 >26 mL/min   Anion gap 8 5 - 15  Lipase, blood  Result Value Ref Range   Lipase 31 11 - 51 U/L  Troponin I (High Sensitivity)  Result Value Ref Range   Troponin I (High Sensitivity) <2 <18 ng/L   DG Chest 2 View  Result Date: 05/27/2020 CLINICAL DATA:  Chest pain EXAM: CHEST - 2 VIEW COMPARISON:  08/12/2012 FINDINGS: The heart size and mediastinal contours are within normal limits. Both lungs are clear. The visualized skeletal structures are unremarkable. IMPRESSION: No active cardiopulmonary disease. Electronically Signed   By: Alcide Clever M.D.   On: 05/27/2020 22:37    EKG EKG Interpretation  Date/Time:  Thursday Karys Meckley 21 2022 22:03:38 EDT Ventricular Rate:  91 PR Interval:  145 QRS Duration: 97 QT Interval:  343 QTC Calculation: 422 R Axis:   185 Text Interpretation: Right and left arm electrode reversal, interpretation assumes no reversal Sinus or ectopic atrial rhythm Right axis deviation Confirmed by Nicanor Alcon, Jaman Aro ([id number]) on 05/28/2020 12:25:57 AM   Radiology DG Chest 2 View  Result Date: 05/27/2020 CLINICAL DATA:  Chest pain EXAM: CHEST - 2 VIEW COMPARISON:  08/12/2012 FINDINGS: The heart size and mediastinal contours are within normal limits. Both lungs are clear. The visualized skeletal structures are unremarkable. IMPRESSION: No active cardiopulmonary disease. Electronically Signed   By:  Alcide Clever M.D.   On: 05/27/2020 22:37    Procedures Procedures   Medications Ordered in ED Medications  ibuprofen (ADVIL) tablet 800 mg (has no administration in time range)  acetaminophen (TYLENOL) tablet 1,000 mg (has no administration in time range)  methocarbamol (ROBAXIN) tablet 750 mg (750 mg Oral Given 05/28/20 0028)    ED Course  I have reviewed the triage vital signs and the nursing notes.  Pertinent labs & imaging results that were available during my care of the patient were reviewed by me and considered in my medical decision making (see chart for details).  Pain with palpation and motion.  Symptoms consistent with muscle strain.  PERC negative wells 0 highly doubt PE in this low risk patient.  Symptoms are highly atypical for cardiac etiology, pain is reproducible with negative EKG and troponin HEART score is 1 very low risk for MACE.  Will treat symptomatically.     Autumn Johnson was evaluated in Emergency Department on 05/28/2020 for the symptoms described in the history of present illness. She was evaluated in the context of the global COVID-19 pandemic, which necessitated consideration that the patient might be at risk for infection with the SARS-CoV-2 virus that causes COVID-19. Institutional protocols and algorithms that pertain to the evaluation of patients at risk for COVID-19 are in a state of rapid  change based on information released by regulatory bodies including the CDC and federal and state organizations. These policies and algorithms were followed during the patient's care in the ED.  Final Clinical Impression(s) / ED Diagnoses Final diagnoses:  Muscle strain   Return for intractable cough, coughing up blood, fevers >100.4 unrelieved by medication, shortness of breath, intractable vomiting, chest pain, shortness of breath, weakness, numbness, changes in speech, facial asymmetry, abdominal pain, passing out, Inability to tolerate liquids or food, cough,  altered mental status or any concerns. No signs of systemic illness or infection. The patient is nontoxic-appearing on exam and vital signs are within normal limits.  I have reviewed the triage vital signs and the nursing notes. Pertinent labs & imaging results that were available during my care of the patient were reviewed by me and considered in my medical decision making (see chart for details). After history, exam, and medical workup I feel the patient has been appropriately medically screened and is safe for discharge home. Pertinent diagnoses were discussed with the patient. Patient was given return precautions.     Leeann Bady, MD 05/28/20 [id number]

## 2020-05-28 NOTE — ED Notes (Signed)
Pt ambulatory to restroom unassisted  

## 2020-09-16 ENCOUNTER — Other Ambulatory Visit: Payer: Self-pay

## 2020-09-16 ENCOUNTER — Ambulatory Visit: Payer: Self-pay | Attending: Family Medicine | Admitting: Family Medicine

## 2020-09-16 ENCOUNTER — Encounter: Payer: Self-pay | Admitting: Family Medicine

## 2020-09-16 DIAGNOSIS — Z833 Family history of diabetes mellitus: Secondary | ICD-10-CM

## 2020-09-16 DIAGNOSIS — Z1159 Encounter for screening for other viral diseases: Secondary | ICD-10-CM

## 2020-09-16 DIAGNOSIS — Z13228 Encounter for screening for other metabolic disorders: Secondary | ICD-10-CM

## 2020-09-16 NOTE — Progress Notes (Signed)
   Virtual Visit via Telephone Note  I connected with Autumn Johnson, on 09/16/2020 at 8:57 AM by telephone due to the COVID-19 pandemic and verified that I am speaking with the correct person using two identifiers.   Consent: I discussed the limitations, risks, security and privacy concerns of performing an evaluation and management service by telephone and the availability of in person appointments. I also discussed with the patient that there may be a patient responsible charge related to this service. The patient expressed understanding and agreed to proceed.   Location of Patient: Home  Location of Provider: Clinic   Persons participating in Telemedicine visit: Isidore Moos Dr. Margarita Rana     History of Present Illness: Autumn Johnson is a 30 y.o. year old female with no significant past medical history who presents today to establish care. She does have a family history of type 2 diabetes mellitus and would like to be screened for diabetes mellitus. She does not take any chronic medications. Denies presence of chest pain, shortness of breath and has no additional concerns today.   No past medical history on file. Not on File  No current outpatient medications on file prior to visit.   No current facility-administered medications on file prior to visit.    ROS: See HPI  Observations/Objective: Awake, alert, oriented x3 Not in acute distress Normal mood  No flowsheet data found.  Lipid Panel  No results found for: CHOL, TRIG, HDL, CHOLHDL, VLDL, LDLCALC, LDLDIRECT, LABVLDL  No results found for: HGBA1C  Assessment and Plan: 1. Family history of diabetes mellitus - Hemoglobin A1c; Future  2. Screening for metabolic disorder - QAS34+HDQQ; Future - Lipid panel; Future  3. Screening for viral disease - HCV RNA quant rflx ultra or genotyp([facility]); Future - HIV Antibody (routine testing w rflx); Future   Follow Up Instructions: Call the clinic for an  appointment with any acute issues   I discussed the assessment and treatment plan with the patient. The patient was provided an opportunity to ask questions and all were answered. The patient agreed with the plan and demonstrated an understanding of the instructions.   The patient was advised to call back or seek an in-person evaluation if the symptoms worsen or if the condition fails to improve as anticipated.     I provided 9 minutes total of non-face-to-face time during this encounter.   Charlott Rakes, MD, FAAFP. [facility] and [place], [place]   09/16/2020, 8:57 AM

## 2020-09-20 ENCOUNTER — Ambulatory Visit: Payer: Self-pay | Attending: Family Medicine

## 2020-09-20 ENCOUNTER — Other Ambulatory Visit: Payer: Self-pay

## 2020-09-20 ENCOUNTER — Encounter (HOSPITAL_COMMUNITY): Payer: Self-pay | Admitting: Emergency Medicine

## 2020-09-20 DIAGNOSIS — Z13228 Encounter for screening for other metabolic disorders: Secondary | ICD-10-CM

## 2020-09-20 DIAGNOSIS — Z833 Family history of diabetes mellitus: Secondary | ICD-10-CM

## 2020-09-20 DIAGNOSIS — Z1159 Encounter for screening for other viral diseases: Secondary | ICD-10-CM

## 2020-09-21 LAB — HCV RNA QUANT RFLX ULTRA OR GENOTYP: HCV Quant Baseline: NOT DETECTED IU/mL

## 2020-09-21 LAB — LIPID PANEL
Chol/HDL Ratio: 4.1 ratio (ref 0.0–4.4)
Cholesterol, Total: 243 mg/dL — ABNORMAL HIGH (ref 100–199)
HDL: 60 mg/dL (ref >39)
LDL Chol Calc (NIH): 158 mg/dL — ABNORMAL HIGH (ref 0–99)
Triglycerides: 141 mg/dL (ref 0–149)
VLDL Cholesterol Cal: 25 mg/dL (ref 5–40)

## 2020-09-21 LAB — CMP14+EGFR
ALT: 18 IU/L (ref 0–32)
AST: 16 IU/L (ref 0–40)
Albumin/Globulin Ratio: 1.4 (ref 1.2–2.2)
Albumin: 4.3 g/dL (ref 3.8–4.8)
Alkaline Phosphatase: 72 IU/L (ref 44–121)
BUN/Creatinine Ratio: 29 — ABNORMAL HIGH (ref 9–23)
BUN: 17 mg/dL (ref 6–20)
Bilirubin Total: 0.3 mg/dL (ref 0.0–1.2)
CO2: 23 mmol/L (ref 20–29)
Calcium: 9.1 mg/dL (ref 8.7–10.2)
Chloride: 104 mmol/L (ref 96–106)
Creatinine, Ser: 0.58 mg/dL (ref 0.57–1.00)
Globulin, Total: 3 g/dL (ref 1.5–4.5)
Glucose: 90 mg/dL (ref 65–99)
Potassium: 4.3 mmol/L (ref 3.5–5.2)
Sodium: 141 mmol/L (ref 134–144)
Total Protein: 7.3 g/dL (ref 6.0–8.5)
eGFR: 123 mL/min/{1.73_m2} (ref >59)

## 2020-09-21 LAB — HEMOGLOBIN A1C
Est. average glucose Bld gHb Est-mCnc: 111 mg/dL
Hgb A1c MFr Bld: 5.5 % (ref 4.8–5.6)

## 2020-09-21 LAB — HIV ANTIBODY (ROUTINE TESTING W REFLEX): HIV Screen 4th Generation wRfx: NONREACTIVE

## 2020-10-01 ENCOUNTER — Telehealth: Payer: Self-pay | Admitting: Family Medicine

## 2020-10-01 ENCOUNTER — Ambulatory Visit: Payer: Self-pay | Admitting: *Deleted

## 2020-10-01 NOTE — Telephone Encounter (Signed)
Pt given lab results via interpreter ID # [phone] per notes of Dr. Alvis Lemmings from 09/21/20 on 10/01/20. Pt verbalized understanding. Patient requested to review each lab result. Patient verbalized understanding of lab results and to work on low cholesterol diet and exercise.

## 2020-10-01 NOTE — Telephone Encounter (Signed)
Pt was called and message was left for patient to return call  

## 2020-10-01 NOTE — Telephone Encounter (Signed)
Copied from CRM [id number]. Topic: General - Call Back - No Documentation >> Oct 01, 2020 10:18 AM Crist Infante wrote: Reason for CRM: pt had labs 8/15 and has not goten a call back about results.  Pt does not have a mychart.  Please call

## 2021-05-21 ENCOUNTER — Other Ambulatory Visit: Payer: Self-pay

## 2021-05-21 ENCOUNTER — Ambulatory Visit (HOSPITAL_COMMUNITY)
Admission: EM | Admit: 2021-05-21 | Discharge: 2021-05-21 | Disposition: A | Discharge status: Home / Self Care | Payer: Self-pay | Attending: Emergency Medicine | Admitting: Emergency Medicine

## 2021-05-21 ENCOUNTER — Encounter (HOSPITAL_COMMUNITY): Payer: Self-pay | Admitting: *Deleted

## 2021-05-21 DIAGNOSIS — K1379 Other lesions of oral mucosa: Secondary | ICD-10-CM

## 2021-05-21 MED ORDER — TRIAMCINOLONE ACETONIDE 0.1 % MT PSTE: 1.0000 "application " | PASTE | Freq: Two times a day (BID) | OROMUCOSAL | 12 refills | Status: DC

## 2021-05-21 NOTE — Discharge Instructions (Addendum)
use pasta dental seg?n las indicaciones. seguimiento con cirug?a si no se resuelve o empeora en cualquier momento ?

## 2021-05-21 NOTE — ED Provider Notes (Signed)
?  [facility] ? ? ?MRN: [id number] DOB: 1990-04-11 ? ?Subjective:  ? ?Chief Complaint;  ?Chief Complaint  ?Patient presents with  ? Abscess  ? ?Pt reports she bite the inside of her mouth several times and dosen't want to heal.  ?Autumn Johnson is a 31 y.o. female presenting for sore inside her right cheek for the last several days which continues to be irritated by her accidentally biting on it.  She has no fevers no tooth pain or gum irritation. ? ? ?Current Facility-Administered Medications:  ?  silver nitrate applicators applicator 1 Stick, 1 Stick, Topical, Once, [place], [place], DO ? ?Current Outpatient Medications:  ?  triamcinolone (KENALOG) 0.1 % paste, Use as directed 1 application. in the mouth or throat 2 (two) times daily., Disp: 5 g, Rfl: 12  ? ?No Known Allergies ? ?Past Medical History:  ?Diagnosis Date  ? Obesity   ?  ? ?Review of Systems  ?All other systems reviewed and are negative. ? ? ?Objective:  ? ?Vitals: ?BP 112/73   Pulse 73   Temp (!) 97.5 ?F (36.4 ?C)   Resp 18   LMP 05/11/2021   SpO2 98%  ? ?Physical Exam ?Constitutional:   ?   Appearance: Normal appearance. She is normal weight.  ?HENT:  ?   Head: Normocephalic.  ?   Mouth/Throat:  ?   Pharynx: No oropharyngeal exudate.  ?   Comments: Right buccal surface has small mucocele approximately 3 mm with secondary mild irritation, no bleeding or gross infection noted, teeth are in overall good repair oral mucosa is moist no gross gingivitis ?Cardiovascular:  ?   Rate and Rhythm: Normal rate and regular rhythm.  ?Pulmonary:  ?   Effort: Pulmonary effort is normal.  ?   Breath sounds: Normal breath sounds.  ?Neurological:  ?   Mental Status: She is alert.  ? ? ?No results found for this or any previous visit (from the past 24 hour(s)). ? ?No results found.  ?  ? ?Assessment and Plan :  ? ?1. Mucocele of buccal mucosa   ? ? ?Meds ordered this encounter  ?Medications  ? triamcinolone (KENALOG) 0.1 % paste  ?  Sig: Use as  directed 1 application. in the mouth or throat 2 (two) times daily.  ?  Dispense:  5 g  ?  Refill:  12  ? ? ?MDM:  ?Autumn Johnson is a 31 y.o. female presenting for small mucocele on the right buccal membrane.  No secondary signs of infection.  No active bleeding oral cavity is in good repair overall.  Patient is prescribed triamcinolone dental paste and encouraged to follow-up with oral surgeon if not improving over the next month or worse for removal.  Language interpreter used for this visit I discussed treatment, follow up and return instructions. Questions were answered. Patient stated understanding of instructions and is stable for discharge.   ? ?Dewaine Conger FNP-C MCN  ?  ?Jone Baseman, NP ?05/21/21 1134 ? ?

## 2021-05-21 NOTE — ED Triage Notes (Signed)
Pt reports she bite the inside of her mouth several times and dosen't want to heal. ?

## 2021-07-13 ENCOUNTER — Encounter: Payer: Self-pay | Admitting: Physician Assistant

## 2021-07-13 ENCOUNTER — Encounter: Payer: Self-pay | Admitting: *Deleted

## 2021-07-13 ENCOUNTER — Other Ambulatory Visit: Payer: Self-pay

## 2021-07-13 ENCOUNTER — Ambulatory Visit: Payer: Self-pay | Attending: Physician Assistant | Admitting: Physician Assistant

## 2021-07-13 VITALS — BP 103/71 | HR 75 | Temp 98.1°F | Resp 16 | Wt 192.0 lb

## 2021-07-13 DIAGNOSIS — E785 Hyperlipidemia, unspecified: Secondary | ICD-10-CM | POA: Insufficient documentation

## 2021-07-13 DIAGNOSIS — R079 Chest pain, unspecified: Secondary | ICD-10-CM | POA: Insufficient documentation

## 2021-07-13 DIAGNOSIS — E7800 Pure hypercholesterolemia, unspecified: Secondary | ICD-10-CM | POA: Insufficient documentation

## 2021-07-13 DIAGNOSIS — Z603 Acculturation difficulty: Secondary | ICD-10-CM

## 2021-07-13 DIAGNOSIS — R1012 Left upper quadrant pain: Secondary | ICD-10-CM | POA: Insufficient documentation

## 2021-07-13 MED ORDER — IBUPROFEN 600 MG PO TABS: 600.0000 mg | ORAL_TABLET | Freq: Three times a day (TID) | ORAL | 0 refills | Status: AC | PRN

## 2021-07-13 NOTE — Progress Notes (Signed)
Patient has been having back/chest discomfort x 3 wk. Patient has taken OTC ibuprofen at times. Per patient, the pain happen sometimes when she is eating

## 2021-07-13 NOTE — Progress Notes (Signed)
Patient ID: Autumn Johnson, female   DOB: [birth year], 31 y.o.   MRN: [id number]   Autumn Johnson, is a 31 y.o. female  [id number]  [id number]  DOB - Jun 25, 1990  Chief Complaint  Patient presents with   Back Pain       Subjective:   Autumn Johnson is a 31 y.o. female here today for a follow up visit - with pain in the L chest, left upper abdomen area.  She is most concerned that something is going on with her heart.  No FH early cardiac issues.  She denies SOB.  No radiating pain.  Pain is mid to L chest and L back.  She has a 33 year old and carries the child on the L side.  The pain is usu sharp and goes away quickly but will sometimes ache and last all day.  This has been occurring intermittently for about 3-4 weeks.  H/o elevated cholesterol.  Appetite is good.  Ibuprofen helps.  Not related to eating.  No vomiting/diarrhea. No rash.  No melena/hematochezia   No problems updated.  ALLERGIES: No Known Allergies  PAST MEDICAL HISTORY: Past Medical History:  Diagnosis Date   Obesity     MEDICATIONS AT HOME: Prior to Admission medications   Medication Sig Start Date End Date Taking? Authorizing Provider  ibuprofen (ADVIL) 600 MG tablet Take 1 tablet (600 mg total) by mouth every 8 (eight) hours as needed. 07/13/21  Yes Israel Wunder, Dionne Bucy, PA-C    ROS: Neg HEENT Neg resp Neg cardiac Neg GU Neg MS Neg psych Neg neuro  Objective:   Vitals:   07/13/21 1557  BP: 103/71  Pulse: 75  Resp: 16  Temp: 98.1 F (36.7 C)  TempSrc: Oral  SpO2: 97%  Weight: 192 lb (87.1 kg)   Exam General appearance : Awake, alert, not in any distress. Speech Clear. Not toxic looking HEENT: Atraumatic and Normocephalic Neck: Supple, no JVD. No cervical lymphadenopathy.  Chest: Good air entry bilaterally, CTAB.  No rales/rhonchi/wheezing CVS: S1 S2 regular, no murmurs.  Abdomen: Bowel sounds present, Non tender and not distended with no gaurding, rigidity or rebound. Extremities:  B/L Lower Ext shows no edema, both legs are warm to touch Neurology: Awake alert, and oriented X 3, CN II-XII intact, Non focal Skin: No Rash  Data Review Lab Results  Component Value Date   HGBA1C 5.5 09/20/2020    Assessment & Plan   1. Chest pain, unspecified type No ST changes on EKG.  NSR.  Believe musculoskeletal and deconditioning - Comprehensive metabolic panel - ibuprofen (ADVIL) 600 MG tablet; Take 1 tablet (600 mg total) by mouth every 8 (eight) hours as needed.  Dispense: 60 tablet; Refill: 0  2. Language barrier, cultural differences [employer] interpreters "Grayland Ormond" used and additional time performing visit was required.   3. LUQ pain Non-acute abdomen-believe musculoskeletal - Lipase - Comprehensive metabolic panel  4. Hyperlipidemia, unspecified hyperlipidemia type - Comprehensive metabolic panel  - Lipid panel  F/up prn  The patient was given clear instructions to go to ER or return to medical center if symptoms don't improve, worsen or new problems develop. The patient verbalized understanding. The patient was told to call to get lab results if they haven't heard anything in the next week.      Freeman Caldron, PA-C [facility] and [place], [place]   07/13/2021, 4:49 PM

## 2021-07-14 ENCOUNTER — Other Ambulatory Visit: Payer: Self-pay | Admitting: Physician Assistant

## 2021-07-14 ENCOUNTER — Ambulatory Visit: Payer: Self-pay

## 2021-07-14 ENCOUNTER — Telehealth: Payer: Self-pay

## 2021-07-14 DIAGNOSIS — E785 Hyperlipidemia, unspecified: Secondary | ICD-10-CM

## 2021-07-14 LAB — COMPREHENSIVE METABOLIC PANEL
ALT: 14 IU/L (ref 0–32)
AST: 16 IU/L (ref 0–40)
Albumin/Globulin Ratio: 2 (ref 1.2–2.2)
Albumin: 4.8 g/dL (ref 3.8–4.8)
Alkaline Phosphatase: 66 IU/L (ref 44–121)
BUN/Creatinine Ratio: 24 — ABNORMAL HIGH (ref 9–23)
BUN: 16 mg/dL (ref 6–20)
Bilirubin Total: 0.3 mg/dL (ref 0.0–1.2)
CO2: 24 mmol/L (ref 20–29)
Calcium: 9 mg/dL (ref 8.7–10.2)
Chloride: 102 mmol/L (ref 96–106)
Creatinine, Ser: 0.68 mg/dL (ref 0.57–1.00)
Globulin, Total: 2.4 g/dL (ref 1.5–4.5)
Glucose: 92 mg/dL (ref 70–99)
Potassium: 3.9 mmol/L (ref 3.5–5.2)
Sodium: 139 mmol/L (ref 134–144)
Total Protein: 7.2 g/dL (ref 6.0–8.5)
eGFR: 119 mL/min/{1.73_m2} (ref >59)

## 2021-07-14 LAB — LIPID PANEL
Chol/HDL Ratio: 3.5 ratio (ref 0.0–4.4)
Cholesterol, Total: 218 mg/dL — ABNORMAL HIGH (ref 100–199)
HDL: 62 mg/dL (ref >39)
LDL Chol Calc (NIH): 130 mg/dL — ABNORMAL HIGH (ref 0–99)
Triglycerides: 150 mg/dL — ABNORMAL HIGH (ref 0–149)
VLDL Cholesterol Cal: 26 mg/dL (ref 5–40)

## 2021-07-14 LAB — LIPASE: Lipase: 29 U/L (ref 14–72)

## 2021-07-14 MED ORDER — ATORVASTATIN CALCIUM 10 MG PO TABS: 10.0000 mg | ORAL_TABLET | Freq: Every day | ORAL | 3 refills | Status: AC

## 2021-07-14 NOTE — Telephone Encounter (Signed)
Pt has been informed of lab results and medication being sent to pharmacy.

## 2021-07-14 NOTE — Telephone Encounter (Signed)
Summary: med ?   Pt called in wanting to know the med, atorvastatin is for. Please call back     Pt advised what atorvastatin is and advised of her labs results. Discussed what are triglycerides, cholesterol. Advised to come back in June 2024 for fasting labs. Advised that exercise and diet also help bring down LDL and raise HDL. Assisted by Spanish interpreter Vicente Males [phone].  Advised to take 1 daily at bedtime. Please send pt hard copy of lab work.      Reason for Disposition  Health Information question, no triage required and triager able to answer question  Answer Assessment - Initial Assessment Questions 1. REASON FOR CALL or QUESTION: "What is your reason for calling today?" or "How can I best help you?" or "What question do you have that I can help answer?"     What is atorvastatin and why taking it?  Protocols used: Information Only Call - No Triage-A-AH

## 2021-07-14 NOTE — Telephone Encounter (Signed)
Copied from CRM [id number]. Topic: General - Other >> Jul 14, 2021 12:06 PM Faith T wrote: Reason for CRM: Pt called in wanting to get lab results, pt requested a call back, please advise.  Patient name and DOB has been verified Patient was informed of lab results. Patient had no questions.

## 2022-04-25 ENCOUNTER — Ambulatory Visit: Payer: Self-pay

## 2022-04-25 NOTE — Telephone Encounter (Signed)
  Chief Complaint: Upper right abdominal Pain and swelling. Symptoms: Intermittent upper abdominal Pain and swelling. Pain increases when she lays down or bends over or her son pushes on her. Frequency: Friday. Pertinent Negatives: Patient denies Fever Disposition: [] ED /[x] Urgent Care (no appt availability in office) / [] Appointment(In office/virtual)/ []  [facility]/ [] Home Care/ [] Refused Recommended Disposition /[] [facility]/ []  Follow-up with PCP Additional Notes: PT states that pain and swelling started Friday. PT states that pain is 7/10 when she bends over. Pain is 3/10 when she lays down. She may also have cramps. Eating or BM makes no difference in pain level. Swelling is in upper right quadrant.    Reason for Disposition  [1] MILD-MODERATE pain AND [2] constant AND [3] present > 2 hours  Answer Assessment - Initial Assessment Questions 1. LOCATION: "Where does it hurt?"      Upper right side 2. RADIATION: "Does the pain shoot anywhere else?" (e.g., chest, back)     Goes to the back 3. ONSET: "When did the pain begin?" (e.g., minutes, hours or days ago)      Friday 4. SUDDEN: "Gradual or sudden onset?"     First was swelling, then discomfort 5. PATTERN "Does the pain come and go, or is it constant?"    - If it comes and goes: "How long does it last?" "Do you have pain now?"     (Note: Comes and goes means the pain is intermittent. It goes away completely between bouts.)    - If constant: "Is it getting better, staying the same, or getting worse?"      (Note: Constant means the pain never goes away completely; most serious pain is constant and gets worse.)      same 6. SEVERITY: "How bad is the pain?"  (e.g., Scale 1-10; mild, moderate, or severe)    - MILD (1-3): Doesn't interfere with normal activities, abdomen soft and not tender to touch..     - MODERATE (4-7): Interferes with normal activities or awakens from sleep, abdomen tender to touch.     -  SEVERE (8-10): Excruciating pain, doubled over, unable to do any normal activities.       Discomfort when she bends over 7/10, also when she lays there pain is 3/10 7. RECURRENT SYMPTOM: "Have you ever had this type of stomach pain before?" If Yes, ask: "When was the last time?" and "What happened that time?"      no 8. AGGRAVATING FACTORS: "Does anything seem to cause this pain?" (e.g., foods, stress, alcohol)     no 9. CARDIAC SYMPTOMS: "Do you have any of the following symptoms: chest pain, difficulty breathing, sweating, nausea?"     no 10. OTHER SYMPTOMS: "Do you have any other symptoms?" (e.g., back pain, diarrhea, fever, urination pain, vomiting)       Nausea and dizzy on Friday, swelling 11. PREGNANCY: "Is there any chance you are pregnant?" "When was your last menstrual period?"       no  Protocols used: Abdominal Pain - Upper-A-AH
